# Patient Record
Sex: Female | Born: 1975 | Race: White | Hispanic: No | Marital: Single | Smoking: Former smoker
Health system: Western US, Academic
[De-identification: ages and names within clinical notes are randomized; demographics above are authoritative.]

## PROBLEM LIST (undated history)

## (undated) DIAGNOSIS — J45909 Unspecified asthma, uncomplicated: Secondary | ICD-10-CM

## (undated) DIAGNOSIS — E079 Disorder of thyroid, unspecified: Secondary | ICD-10-CM

## (undated) DIAGNOSIS — F32A Depression, unspecified: Secondary | ICD-10-CM

## (undated) DIAGNOSIS — F419 Anxiety disorder, unspecified: Secondary | ICD-10-CM

## (undated) DIAGNOSIS — T148XXA Other injury of unspecified body region, initial encounter: Secondary | ICD-10-CM

## (undated) HISTORY — DX: Other injury of unspecified body region, initial encounter: T14.8XXA

## (undated) HISTORY — DX: Disorder of thyroid, unspecified: E07.9

## (undated) HISTORY — DX: Depression, unspecified: F32.A

## (undated) HISTORY — DX: Unspecified asthma, uncomplicated: J45.909

## (undated) HISTORY — DX: Anxiety disorder, unspecified: F41.9

## (undated) HISTORY — PX: LOWER EXTREMITY BONE SURGERY: SHX900009

## (undated) HISTORY — PX: HYSTERECTOMY: AMBSHX0005

## (undated) NOTE — ED Provider Notes (Signed)
Formatting of this note is different from the original.  Danielle Barber is a 89 Y female.    Chief Complaint   Patient presents with   ? SHORTNESS OF BREATH     Triage note - presented flushed faced and hyperventilating, ambulatory, airway clear, CC sob. HR 170 intially, after seated and breathing slowed HR 144. pt states she has had cough, hx of asthma, took approx 5-6 puffs albuterol MDI and 2 energy drinks and coffee. denies recent fever or cardiac hx    HPI: 62 year old female with past medical history of asthma, hypothyroidism, insomnia presenting with complaint of shortness of breath.  Patient notes 1 week of a largely nonproductive cough noting occasional phlem but more likely causing her to have coughing fits and gag intermittently.  She denies fevers or chills.  She notes this morning after her shower she began coughing and had a coughing fit where she could not breathe.  She notes she feels like she might of had a panic attack with this.  Her heart rate was going fast and she felt short of breath.  She denies chest pain or pressure but notes that her chest felt tight.  She denies history of leg pain swelling, immobility, history of DVT or PE. No n/v, no diaphoresis.   She is not a smoker and denies drug use.  No recent sick contacts.  She notes she does consume large amounts of caffeine and had 2 energy drinks and coffee earlier today.  She also notes using albuterol frequently this morning.  She notes history of 1 panic attack in the past.  She denies new stressors or suicidal or homicidal ideation.  She notes symptoms resolving without interventions       PMH:  Patient Active Problem List:     INSOMNIA    ALLERGIC RHINITIS    MODERATE PERSISTENT ASTHMA    HYPOTHYROIDISM    LEFT FIBULA FX    HX OF TOTAL HYSTERECTOMY    Allergies:    Allergies   Allergen Reactions   ? Septra [Co-Trimoxazole] Rash     Was taking bacterim at the same time, could be sulfa.  Needs testing.   ? Keflex [Cephalexin] Rash     Was  taking bacterim at the same time, could be sulfa.  Needs testing.     SocHx:      Social History     Tobacco Use   ? Smoking status: Former     Packs/day: 1.00     Years: 8.00     Pack years: 8.00     Types: Cigarettes     Start date: 05/16/1994     Quit date: 05/16/2002     Years since quitting: 19.5   ? Smokeless tobacco: Never   Vaping Use   ? Vaping Use: Never used   Substance Use Topics   ? Alcohol use: Yes     Alcohol/week: 0.0 standard drinks     Comment: occasionally   ? Drug use: No     E-Cigarettes/Vaping     Questions Responses    E-Cigarette/Vaping Use Never User    Passive Exposure No    Counseling Given No     E-Cigarette/Vaping Substances     Questions Responses    Nicotine No    THC No    CBD No         FamHx:   Family History   Problem Relation Age of Onset   ? Hypertension Mother    ?  Ovarian Cancer Maternal Grandmother    ? Colon Cancer Maternal Uncle    ? Breast Cancer None    ? Cervical Cancer None    ? Uterine Cancer None      Review of Systems   Constitutional: Negative.  Negative for chills and fever.   HENT: Negative.  Negative for hearing loss.    Eyes: Negative.  Negative for discharge and redness.   Respiratory: Positive for cough, chest tightness and shortness of breath.    Cardiovascular: Positive for palpitations. Negative for chest pain and leg swelling.   Gastrointestinal: Negative.  Negative for abdominal pain, nausea and vomiting.   Genitourinary: Negative.  Negative for dysuria, frequency and urgency.   Musculoskeletal: Negative.  Negative for back pain and neck pain.   Skin: Negative.  Negative for rash.   Allergic/Immunologic: Negative for environmental allergies.   Neurological: Negative.  Negative for headaches.   Hematological: Does not bruise/bleed easily.   Psychiatric/Behavioral: Negative.  Negative for suicidal ideas.     Patient Vitals for the past 24 hrs:   Temp Temp Source Pulse BP Resp SpO2 O2 Delivery   11/22/21 1614 98.5 F (36.9 C) Temporal Artery Scan 110 152/81 19  98 % RA   11/22/21 1430 -- -- 109 142/63 18 96 % RA   11/22/21 1212 99 F (37.2 C) Oral 148 139/81 32 99 % RA     Physical Exam  Constitutional:       General: She is not in acute distress.     Appearance: She is well-developed. She is not ill-appearing.   HENT:      Right Ear: Hearing normal. No drainage.      Left Ear: Hearing normal. No drainage.      Nose: Nose normal. No nasal deformity.      Mouth/Throat:      Mouth: Mucous membranes are moist.      Pharynx: No oropharyngeal exudate or posterior oropharyngeal erythema.   Eyes:      General: No scleral icterus.     Conjunctiva/sclera: Conjunctivae normal.   Neck:      Thyroid: No thyromegaly (visable).      Trachea: No tracheal deviation.   Cardiovascular:      Rate and Rhythm: Regular rhythm. Tachycardia present.      Pulses: Normal pulses.      Heart sounds: Normal heart sounds.   Pulmonary:      Effort: Pulmonary effort is normal. No tachypnea, accessory muscle usage or respiratory distress.      Breath sounds: Normal breath sounds. No decreased breath sounds or wheezing.      Comments: CTA bilaterally  No increased WOB, no wheezing/retractions  Chest:      Chest wall: No tenderness or crepitus.   Abdominal:      General: Bowel sounds are normal. There is no distension.      Tenderness: There is no abdominal tenderness.   Musculoskeletal:      Comments: No calf pain/swelling   Neurological:      Mental Status: She is alert and oriented to person, place, and time.      GCS: GCS eye subscore is 4. GCS verbal subscore is 5. GCS motor subscore is 6.      Cranial Nerves: Cranial nerves 2-12 are intact.      Sensory: Sensation is intact.      Motor: Motor function is intact.      Gait: Gait is intact.   Psychiatric:  Thought Content: Thought content does not include homicidal or suicidal ideation. Thought content does not include homicidal or suicidal plan.         Judgment: Judgment normal.     Impression    58 year old female with past medical history of  asthma, hypothyroidism, insomnia presenting with complaint of shortness of breath, tachycardia, cough x 1 week    Diff Dx  Panic attache, arrhythmia, electrolyte abnormality, dehydration ACS/PE, medication side effect, caffeine side effect    Plan  EKG  Chest Xray  Labs  IVF  Serial exams    Results:  Recent Results (from the past 48 hour(s))   CHEM 7 (NA, K, CL, CO2, BUN, GLUC, CR)    Collection Time: 11/22/21 12:46 PM   Result Value Ref Range    Sodium 138 135 - 145 mEq/L    Potassium 3.6 3.5 - 5.3 mEq/L    Chloride 102 100 - 111 mEq/L    CO2 24 24 - 33 mEq/L    Anion gap, ser/plas 12 5 - 16 mEq/L    BUN 13 7 - 27 mg/dL    GLUCOSE, RANDOM 960 60 - 159 mg/dL    Creatinine 4.54 <=0.98 mg/dL    Estimated Glomerular Filtration Rate >60 >=60 mL/min/1.73 m2   MAGNESIUM    Collection Time: 11/22/21 12:46 PM   Result Value Ref Range    MAGNESIUM 1.8 1.7 - 2.3 mg/dL   TROPONIN I, HIGH SENSITIVITY    Collection Time: 11/22/21 12:46 PM   Result Value Ref Range    TROPONIN I, HIGH SENSITIVITY <2 <=12 ng/L   B-TYPE NATRIURETIC PEPTIDE (BNP)    Collection Time: 11/22/21 12:46 PM   Result Value Ref Range    B type natriuretic protein 25 <=100 pg/mL   D-DIMER, QUANTITATIVE    Collection Time: 11/22/21 12:46 PM   Result Value Ref Range    D-DIMER <0.27 <=0.49 ug/mL FEU   CBC W AUTOMATED DIFFERENTIAL    Collection Time: 11/22/21 12:46 PM   Result Value Ref Range    WBC COUNT 14.3 (H) 3.7 - 11.1 K/uL    Red blood cells count 4.56 3.60 - 5.70 M/uL    Hgb 13.8 11.5 - 15.0 g/dL    Hematocrit 11.9 14.7 - 46.0 %    MCV 91 80 - 100 fL    RDW, RBC 14.2 12.0 - 16.5 %    Platelets count 404 (H) 140 - 400 K/uL    RBC's, nucleated 0 <=0 /100WC   WBC AUTO DIFF    Collection Time: 11/22/21 12:46 PM   Result Value Ref Range    Neutrophils, Automated Count 10.6 (H) 1.8 - 7.9 K/uL    Lymphocytes, Automated Count 2.2 0.9 - 3.2 K/uL    Monocytes, Automated Count 1.0 (H) 0.3 - 0.9 K/uL    Eosinophils, Automated Count 0.4 0.0 - 0.4 K/uL    Basophils,  Automated Count 0.1 0.0 - 0.1 K/uL    Immature Granulocytes, Automated Count 0.1 0.0 - 0.1 K/uL    Neutrophils %, Automated count 74 %    Lymphocytes %, Automated count 15 %    Monocytes %, Automated Count 7 %    Eosinophils %, Automated Count 2 %    Basophils %, Automated Count 1 %    Immature Granulocytes %, Automated Count 1 %   DRAW AND HOLD SERUM, RED TOP TUBE    Collection Time: 11/22/21  3:05 PM   Result Value Ref Range  Draw and hold plain red top tube COMPLETE    LACTIC ACID    Collection Time: 11/22/21  3:08 PM   Result Value Ref Range    Lactate, ser/plas 0.9 0.5 - 1.9 mmol/L    Narrative    RN, please cancel second lactate Lab order if initial result is less than 2.0  mg/dL.   TROPONIN I, HIGH SENSITIVITY    Collection Time: 11/22/21  3:28 PM   Result Value Ref Range    TROPONIN I, HIGH SENSITIVITY <2 <=12 ng/L    TROPONIN I, HIGH SENSITIVITY, DELTA VALUE 0 ng/L    TROPONIN I, HIGH SENSITIVITY DELTA TIME 162 minute(s)   SARS-COV-2, (COVID-19), RNA, QUALITATIVE RT-NAA    Collection Time: 11/22/21  3:41 PM   Result Value Ref Range    SARS-COV-2 (COVID-19) PANEL, NAA COVID NOT DETECTED     Specimen source NP/OP      Labs notable for neg trop x 2, neg d-dimer, normal electrolytes including mag, BNP 25, WBC 14, lactate 0.9, COVID neg    EKG: (my reading) sinus tachycardia, Rate- 129 bpm, QRS- Normal, ST/T segments- Normal.  The initial EKG is not consistent with criteria for STEMI.    XR CHEST, PA AND LATERAL...    Result Date: 11/22/2021  XRAY CHEST ** HISTORY **: 25 years old,  Shortness of Breath ** TECHNIQUE **: 2 views of the chest acquired. COMPARISON: None available. ** FINDINGS **: LUNGS: No focal consolidation, pneumothorax or significant pleural effusion. MEDIASTINUM/OTHER: Normal heart size and mediastinal contours.     No acute disease.     Emergency Department Course:   Patient with no arrhythmia on EKG or cardiac monitor  Patient given IVF and HR improved from 120's on EKG to 90's on repeat  exam.   Patient with no chest pain throughout ER visit  Patient with no increased WOB on presentation or on repeat exam  Xray neg for acute process  WBC elevated - in setting of likely viral illness/cough and stress reaction. Lactate normal    Patient notes symptoms resolved and notes she is feeling better  Plan for discharge home with albuterol/spacer and tessalon pearls  Work note provided  Encouraged to limit caffeine intake    Pt to follow up with PCP and referral placed for TAV  Strict return precautions reviewed with pt    Final Diagnosis:   COUGH, UNSPECIFIED  (primary encounter diagnosis)  TACHYCARDIA  Note: Resolved with IVF, likely multifactorial - panic attack, albuterol and caffeine side effect    Condition on discharge / transfer : stable    Routine conservative discharge instructions were given and the patient is to follow up with the regular doctor or return if worse. Findings of pertinent objective studies were reviewed with the patient. Potential medication side effects, cautions, risks, and rationale were discussed.   Discussed the care provided, diagnosis, expected course, what to watch/return for, and follow up with regular MD; these understood.     I have reviewed the clinical diagnoses listed below which were considered in the care of this patient.  At the time of this visit there are no changes in these conditions unless otherwise noted.  The patient will be advised to follow up after discharge with their PCP or appropriate specialist as treatment warrants.    Clinical Diagnoses:      MODERATE PERSISTENT ASTHMA    HYPOTHYROIDISM             Electronically signed by Dorthy Cooler  Ladona Ridgel (M.D.) at 11/22/2021 10:27 PM PDT

## (undated) NOTE — Telephone Encounter (Signed)
Please follow up. Thank you.     Electronically signed by Jeri Modena (M.D.) at 03/21/2022  4:42 PM PST

## (undated) NOTE — Progress Notes (Signed)
Formatting of this note is different from the original.  Endocrinology Video Consultation    CC:  Thyroid     HPI:  Ms. Danielle Barber is a 42 Y old female, who was referred to Endocrinology for evaluation of Hypothyroidism.    Econsult:  "pt with hx of hypothyroidism since at least 2016. has been on levothyroxine and TSH fairly stable until May 2023. despite gradual increases in her levothyroxine, pt continues to have rise in her tsh."    Was on thyroid hormone 8-9 y/o and then stopped. Then about 5-6 years ago, she was restarted on levothyroxine.    Dose was increased from 137 mcg to 175 mcg 2 months ago. Labs not yet repeated.  When she was on 137 mcg she was having dizzy spells. Feeling better on 175 mcg.    Takes medication with a caffeine energy drink.  Takes it daily.  Tries to wait 30 minutes before eating food.    Wondering if the hormones are causing more issues with rosacea.  Interested in why her TSH is fluctuating.    I reviewed Meds, Allergies & Problems.    Medications:  I reviewed the medications list.  Outpatient Medications Marked as Taking for the 03/09/22 encounter (Video Visit) with Eliot Ford (M.D.)   Medication Sig Dispense Refill   ? Escitalopram (LEXAPRO) 20 mg Oral Tab Take 1 tablet by mouth daily 100 tablet 3   ? Levothyroxine (LEVOTHROID/SYNTHROID) 175 mcg Oral Tab Take 1 tablet by mouth daily 30 minutes before breakfast 90 tablet 3   ? buPROPion (WELLBUTRIN XL) 300 mg Oral 24hr XL Tab Take 1 tablet by mouth every morning 100 tablet 0   ? Montelukast (SINGULAIR) 10 mg Oral Tab Take 1 tablet by mouth every evening 90 tablet 2   ? inhaler, assist devices (AeroChamber Z STAT Plus Flow Signal Inhaler Spacer) Misc Spacer Use as directed with inhaler 1 Each 0   ? Albuterol (PROAIR/PROVENTIL/VENTOLIN) 90 mcg/actuation Inhl HFAA Inhale 2 puffs by mouth every 4 hours as needed for quick relief of asthma symptoms . Shake well before use. Rinse mouthpiece at least 1 time every week 18 g 1   ?  Clobetasol (TEMOVATE) 0.05 % Top Oint Apply to affected area(s) 2 times a day as needed for rash . Stop when rash is gone. Do not use on face, neck or skin folds 60 g 3   ? Acetaminophen (TYLENOL) 500 mg Oral Tab Take 1 to 2 tablets by mouth every 6 hours as needed for pain or fever. Do not exceed 6 tablets in 24 hours 100 tablet 0     Physical Exam:  APC Flowsheet 11/22/2021   BP sys 152   BP dias 81   Pulse 110   SPO2 98   HEIGHT    WEIGHT 101.6 kg   WEIGHT 224 lbs   BMI 40.97     General: No acute distress  Affect:  Appropriate    Data Reviewed:  I personally reviewed the following labs and studies.    Thyroid Lab 01/23/2015 June 19, 2016 07/22/2016 11/15/2017 01/30/2018 07/05/2019   TSH 9.40 (H) 6.11 (H) 16.58 (H) 4.55 (H) 2.0 3.9     Thyroid Lab 01/01/2020 09/24/2021 11/22/2021 01/11/2022 on levo 125 mcg   TSH 3.1 5.3 (H) 8.1 (H) 11.0 (H)     -----------------------------------------------------------------------------------------------------------------------------------------  Assessment/Plan:    1.  Hypothyroidism:  Needs repeat labs to assess 175 mcg dose. We discussed appropriate admin. If her TSH is normal,  it may decrease with taking the medication with only water.    Plan:  - Check TSH, FT4 now. Explained TPO ab not indicated  - Celiac panel    I have educated/instructed the patient or caregiver regarding all aspects of the above stated plan of care.  The patient or caregiver indicated understanding.  An interpreter was not used.    FOLLOW-UP:  Patient will follow-up in Endocrinology clinic PRN. Patient is aware that she will need to call to make an appointment.    You Tilda Franco, M.D.  Department of Endocrinology and Metabolism  Electronically signed by Eliot Ford (M.D.) at 03/09/2022 10:24 AM PDT

## (undated) NOTE — Unmapped External Note (Signed)
Formatting of this note is different from the original.  TELEDERMATOLOGY CONSULTATION NOTE:    ASSESSMENT/PLAN:    Nummular eczema vs allergic contact dermatitis  -stop neosporin and bandaging  -clobetasol ointment and mupirocin ointment mix 1:1 and apply 3 times daily for 1 week. Then twice daily for 1 week. Then once daily for 1 week.  Then use as needed  -Moisturize daily with a fragrance free cream such as Cerave Moisturizing cream, Aveeno Eczema Therapy Cream or Eucerin Eczema Relief Cream can help to prevent the eczema from coming back    PLEASE DO NOT CUT/PASTE BELOW THIS LINE  ========================================================================    Below documentation will provide data for Teledermatology improvement purposes.    Request sent for: Rash/inflammatory.  Image type: Stentor  Dermoscopy: Absent  Clinical photographs quality: Adequate  Disposition: Advice given and/or follow up with referring provider     History and photograph(s) reviewed.  From referring MD:   "  ROS     Rash   What is your clinical concern (confirm dx, tx recs)? Diagnosis and treat rec   Duration of rash (days/wks/months/yrs): 3 years on and off post ankle surgery   Body location(s), be specific: left ankle, lateral aspect   Symptoms (be specific: itching, painful, none): itching, sometimes with some oozing   Past treatments/duration: neosporin and triamcinolone for months back in 2019, rarely uses neosporin now. Also uses over the counter hydrocortisone PRN but helps only momentarily.    Current treatment: trying not to touch it. Sometimes just putting a bandage over it.   Any relevant history (URI, meds, exposures, stress, travel, etc): none     "    For questions regarding this teledermatology case, please contact me directly through [facility] or [employer]. Thanks.    Melanee Spry, MD   Department of Dermatology  [facility]   Electronically signed by Melanee Spry (M.D.) at 04/24/2020  9:57 AM PST

## (undated) NOTE — Progress Notes (Signed)
Formatting of this note is different from the original.  Clinical Video Visit Note     Assessment & Plan     (E03.9) HYPOTHYROIDISM  (primary encounter diagnosis)  -TSH continues to uptrend despite increase in levothyroxine. Patient taking on empty stomach as prescribed. Patient with some increased symptoms of fatigue. Called pconsult endocrinology, but no answer. Suspect they were on the other line. Referral was placed to endocrinology. Patient has appointment with endocrinology on Monday. Advised patient to get updated labs tomorrow so that endocrinology team has more updated values to review. No red flag symptoms at this time. Patient expressed understanding and is agreeable to plan.     History of Present Illness   Danielle Barber is a 19 Y female scheduled to discuss the following issues:thyroid issues.     pt with hx of hypothyroidism since at least 2016. has been on levothyroxine and TSH fairly stable until May 2023. despite gradual increases in her levothyroxine, pt continues to have rise in her tsh. Also was getting dizzy (which resolved with last increase in levothyroxine). Always fatigued. Wondering what best next steps are as continuing to increase the levothyroxine has not been contributing to improvements in TSH. Instead, the TSH levels continue to uptrend.     Objective   LMP  (LMP Unknown)   Constitutional: Patient appears normal without distress.  Eyes: Normal eyes w/out inflammation or conjunctivitis, extraocular (eye) movements intact  Ears, Nose, Throat: Normal facial symmetry, no skin lesions, normal voice  Respiratory: No tachypnea, no unusual cough  Psychiatric: Stable emotional state, normal thought patterns    Electronically signed by Izola Price (M.D.) at 03/03/2022  2:33 PM PDT

## (undated) NOTE — Unmapped External Note (Signed)
Formatting of this note is different from the original.  Outpatient Medications Marked as Taking for the 03/09/22 encounter (Video Visit) with Eliot Ford (M.D.)   Medication Sig Dispense Refill   ? Escitalopram (LEXAPRO) 20 mg Oral Tab Take 1 tablet by mouth daily 100 tablet 3   ? Levothyroxine (LEVOTHROID/SYNTHROID) 175 mcg Oral Tab Take 1 tablet by mouth daily 30 minutes before breakfast 90 tablet 3   ? buPROPion (WELLBUTRIN XL) 300 mg Oral 24hr XL Tab Take 1 tablet by mouth every morning 100 tablet 0   ? Montelukast (SINGULAIR) 10 mg Oral Tab Take 1 tablet by mouth every evening 90 tablet 2   ? inhaler, assist devices (AeroChamber Z STAT Plus Flow Signal Inhaler Spacer) Misc Spacer Use as directed with inhaler 1 Each 0   ? Albuterol (PROAIR/PROVENTIL/VENTOLIN) 90 mcg/actuation Inhl HFAA Inhale 2 puffs by mouth every 4 hours as needed for quick relief of asthma symptoms . Shake well before use. Rinse mouthpiece at least 1 time every week 18 g 1   ? Clobetasol (TEMOVATE) 0.05 % Top Oint Apply to affected area(s) 2 times a day as needed for rash . Stop when rash is gone. Do not use on face, neck or skin folds 60 g 3   ? Acetaminophen (TYLENOL) 500 mg Oral Tab Take 1 to 2 tablets by mouth every 6 hours as needed for pain or fever. Do not exceed 6 tablets in 24 hours 100 tablet 0   Benadryl prn  Electronically signed by Michelle Nasuti (M.A.) at 03/09/2022  9:52 AM PDT

---

## 2004-09-17 DIAGNOSIS — F32A Depression, unspecified: Secondary | ICD-10-CM | POA: Insufficient documentation

## 2004-09-17 DIAGNOSIS — J309 Allergic rhinitis, unspecified: Secondary | ICD-10-CM | POA: Insufficient documentation

## 2004-09-17 DIAGNOSIS — G4700 Insomnia, unspecified: Secondary | ICD-10-CM | POA: Insufficient documentation

## 2005-06-23 DIAGNOSIS — R0600 Dyspnea, unspecified: Secondary | ICD-10-CM | POA: Insufficient documentation

## 2005-06-23 DIAGNOSIS — G4769 Other sleep related movement disorders: Secondary | ICD-10-CM | POA: Insufficient documentation

## 2009-05-25 DIAGNOSIS — J4540 Moderate persistent asthma, uncomplicated: Secondary | ICD-10-CM | POA: Insufficient documentation

## 2015-01-29 DIAGNOSIS — E039 Hypothyroidism, unspecified: Secondary | ICD-10-CM | POA: Insufficient documentation

## 2015-12-31 DIAGNOSIS — S82402A Unspecified fracture of shaft of left fibula, initial encounter for closed fracture: Secondary | ICD-10-CM | POA: Insufficient documentation

## 2017-11-15 LAB — THYROID STIMULATING HORMONE: Thyroid Stimulating Hormone: 4.55 u[IU]/mL — ABNORMAL HIGH (ref 0.40–4.20)

## 2017-11-15 LAB — CBC NO DIFFERENTIAL
Hematocrit: 43 % (ref 34.0–46.0)
Hemoglobin: 13.4 g/dL (ref 11.5–15.0)
MCV: 98 fL (ref 80–100)
Platelets count: 375 10*3/uL (ref 140–400)
RBC's, nucleated: 0 /100{WBCs} (ref <=0)
RDW, RBC: 13.6 % (ref 12.0–16.5)
Red blood cells count: 4.39 M/uL (ref 3.60–5.70)
WBC Count: 9.9 10*3/uL (ref 3.7–11.1)

## 2017-11-15 LAB — WBC AUTO DIFF (OUTSIDE ORGANIZATION)
Basophils % Auto: 1 % (ref 0–1)
Eosinophils % Auto: 4 % (ref 0–7)
Immature Granulocytes % Auto: 0 % (ref 0–1)
Lymphocytes % Auto: 16 % (ref 15–47)
Monocytes % Auto: 8 % (ref 5–13)
Neutrophils % Auto: 71 % (ref 42–76)
Neutrophils, Automated Count: 7 10*3/uL (ref 1.8–7.9)

## 2018-01-30 LAB — THYROID STIMULATING HORMONE: Thyroid Stimulating Hormone: 2 u[IU]/mL (ref 0.4–4.2)

## 2019-07-05 LAB — CBC NO DIFFERENTIAL
Hematocrit: 45.5 % (ref 34.0–46.0)
Hemoglobin: 14.1 g/dL (ref 11.5–15.0)
MCV: 92 fL (ref 80–100)
Platelets count: 502 10*3/uL — ABNORMAL HIGH (ref 140–400)
RBC's, nucleated: 0 /100{WBCs} (ref <=0)
RDW, RBC: 13.1 % (ref 12.0–16.5)
Red blood cells count: 4.93 M/uL (ref 3.60–5.70)
WBC Count: 10.7 10*3/uL (ref 3.7–11.1)

## 2019-07-05 LAB — WBC AUTO DIFF (OUTSIDE ORGANIZATION)
Basophils % Auto: 1 % (ref 0–1)
Eosinophils % Auto: 5 % (ref 0–7)
Immature Granulocytes % Auto: 0 % (ref 0–1)
Lymphocytes % Auto: 24 % (ref 15–47)
Monocytes % Auto: 10 % (ref 5–13)
Neutrophils % Auto: 60 % (ref 42–76)
Neutrophils, Automated Count: 6.4 10*3/uL (ref 1.8–7.9)

## 2019-07-06 LAB — THYROID STIMULATING HORMONE: Thyroid Stimulating Hormone: 3.9 u[IU]/mL (ref 0.4–4.2)

## 2020-01-01 LAB — ABORH (OUTSIDE ORGANIZATION)
ABORH Blood Type: A POS
ABORH Blood Type: A POS

## 2020-01-02 LAB — CBC NO DIFFERENTIAL
Hematocrit: 28.9 % — ABNORMAL LOW (ref 34.0–46.0)
Hemoglobin: 8.5 g/dL — ABNORMAL LOW (ref 11.5–15.0)
MCV: 76 fL — ABNORMAL LOW (ref 80–100)
Platelets count: 706 10*3/uL — ABNORMAL HIGH (ref 140–400)
RBC's, nucleated: 0 /100{WBCs} (ref <=0)
RDW, RBC: 15.3 % (ref 12.0–16.5)
Red blood cells count: 3.78 M/uL (ref 3.60–5.70)
WBC Count: 11.9 10*3/uL — ABNORMAL HIGH (ref 3.7–11.1)

## 2020-01-02 LAB — CREATININE/E-GFR
Creatinine Blood: 0.93 mg/dL (ref <=1.11)
Glomerular Filtration Rate - African American: >60 mL/min (ref >=60)
Glomerular filtration rate, nonAfrican American: >60 mL/min (ref >=60)

## 2020-01-02 LAB — WBC AUTO DIFF (OUTSIDE ORGANIZATION)
Basophils % Auto: 1 % (ref 0–1)
Eosinophils % Auto: 4 % (ref 0–7)
Immature Granulocytes % Auto: 0 % (ref 0–1)
Lymphocytes % Auto: 19 % (ref 15–47)
Monocytes % Auto: 9 % (ref 5–13)
Neutrophils % Auto: 67 % (ref 42–76)
Neutrophils, Automated Count: 8 10*3/uL — ABNORMAL HIGH (ref 1.8–7.9)

## 2020-01-02 LAB — THYROID STIMULATING HORMONE: Thyroid Stimulating Hormone: 3.1 u[IU]/mL (ref 0.4–4.2)

## 2020-01-02 LAB — FERRITIN: Ferritin: <2 ng/mL — ABNORMAL LOW (ref 22–291)

## 2020-01-05 LAB — HAPPY TOGETHER UNMAPPED RESULTS: COVID-19 Results (Outside Organization): NOT DETECTED

## 2020-01-31 LAB — GLUCOSE: Glucose: 103 mg/dL (ref 60–159)

## 2020-01-31 LAB — CREATININE/E-GFR
Creatinine Blood: 0.95 mg/dL (ref <=1.11)
Glomerular Filtration Rate - African American: >60 mL/min (ref >=60)
Glomerular filtration rate, nonAfrican American: >60 mL/min (ref >=60)

## 2020-01-31 LAB — UREA NITROGEN, BLOOD (BUN): Urea Nitrogen, Blood (BUN): 8 mg/dL (ref 7–27)

## 2020-01-31 LAB — POTASSIUM: Potassium: 4.4 meq/L (ref 3.5–5.3)

## 2020-01-31 LAB — SODIUM: Sodium: 139 meq/L (ref 135–145)

## 2020-01-31 LAB — CHLORIDE: Chloride: 104 meq/L (ref 100–111)

## 2020-01-31 LAB — ABORH (OUTSIDE ORGANIZATION): ABORH Blood Type: A POS

## 2020-01-31 LAB — CARBON DIOXIDE TOTAL: CO2: 27 meq/L (ref 24–33)

## 2020-02-01 LAB — CBC NO DIFFERENTIAL
Hematocrit: 38 % (ref 34.0–46.0)
Hemoglobin: 11.1 g/dL — ABNORMAL LOW (ref 11.5–15.0)
MCV: 87 fL (ref 80–100)
Platelets count: 454 10*3/uL — ABNORMAL HIGH (ref 140–400)
RBC's, nucleated: 0 /100{WBCs} (ref <=0)
RDW, RBC: 27.2 % — ABNORMAL HIGH (ref 12.0–16.5)
Red blood cells count: 4.36 M/uL (ref 3.60–5.70)
WBC Count: 7.8 10*3/uL (ref 3.7–11.1)

## 2020-02-01 LAB — WBC AUTO DIFF (OUTSIDE ORGANIZATION)
Basophils % Auto: 1 % (ref 0–1)
Eosinophils % Auto: 4 % (ref 0–7)
Immature Granulocytes % Auto: 0 % (ref 0–1)
Lymphocytes % Auto: 20 % (ref 15–47)
Monocytes % Auto: 10 % (ref 5–13)
Neutrophils % Auto: 65 % (ref 42–76)
Neutrophils, Automated Count: 5 10*3/uL (ref 1.8–7.9)

## 2020-02-01 LAB — RBC MORPHOLOGY (OUTSIDE ORGANIZATION)

## 2020-02-01 LAB — HEMOGLOBIN A1C
Hgb A1C,Glucose Est Avg: 82 mg/dL — ABNORMAL LOW (ref 85–126)
Hgb A1c %: 4.5 % (ref <=5.6)

## 2020-02-01 LAB — FERRITIN: Ferritin: 133 ng/mL (ref 22–291)

## 2020-02-04 LAB — HAPPY TOGETHER UNMAPPED RESULTS: COVID-19 Results (Outside Organization): NOT DETECTED

## 2020-02-17 DIAGNOSIS — Z90710 Acquired absence of both cervix and uterus: Secondary | ICD-10-CM | POA: Insufficient documentation

## 2020-04-21 ENCOUNTER — Ambulatory Visit: Payer: 59

## 2020-04-21 DIAGNOSIS — Z23 Encounter for immunization: Secondary | ICD-10-CM

## 2020-04-21 NOTE — Progress Notes (Signed)
Patient WAS wearing a surgical mask  Droplet precautions were followed when caring for the patient.   PPE used by provider during encounter: Surgical mask and Gloves    Influenza Vaccine Documentation:  The patient is receiving their Influenza Vaccine today: No    COVID-19 Vaccine Documentation:  The patient is eligible to receive the COVID-19 Vaccine at this time: Yes    The patient has received a COVID vaccine previously: Yes     Immunization History   Administered Date(s) Administered        COVID-19 mRNA PF 30 mcg/0.3 mL (Pfizer)  COVID-19 mRNA PF 30 mcg/0.3 mL [employer])   07/24/2019   08/14/2019         The patient or person named in permission acknowledges receipt of the Fact Sheet for Recipients and Caregivers, which includes information about the risks and benefits of the vaccine and available alternatives, for the following vaccine manufacturer: Pfizer-BioNtech     The patient or person named in permission was informed the FDA has approved [employer] COVID-19 Vaccine as a 2-dose primary series for ages 78 and older. The FDA has authorized the emergency use of the Pfizer COVID-19 Vaccine as a primary series additional 3rd dose for immunocompromised individuals ages 59 and older and as a booster dose given after the completion of a primary series for ages 37 and older, which is not an FDA-approved vaccine additional dose or booster and that the patient may accept or refuse the vaccine.     Pre-Screening Documentation:        Additional and Booster Dose Considerations  Is the patient receiving a 3rd or Booster dose today?: Yes - Booster Dose  If Yes - Booster Dose, confirmed the patient's last dose of Moderna/Pfizer was administered more than 6 months ago OR patient's last dose of Linwood Dibbles was administered more than 2 months ago? : Yes    Janssen (Johnson & Laural Benes) Vaccine Considerations  Is patient receiving the Linwood Dibbles Laural Benes & Laural Benes) COVID-19 Vaccine?: No      COVID-19 Immunization Absolute  Contraindications  1. Have you ever had a severe allergic reaction to a previous dose of the COVID-19 Vaccine or to any ingredient of the COVID-19 Vaccine? (Review ingredients in the Fact Sheet for Recipients and Caregivers): No  2. Have you ever had an immediate allergic reaction of any severity within 4 hours of receiving a previous dose of the COVID-19 Vaccine or any of its ingredients?: No  3. Have you ever had an immediate allergic reaction of any severity to polysorbate or polyethylene glycol [PEG]?: No  Did the patient answer yes to any of questions 1-3?: No (proceed to question 4)      COVID-19 Immunization Myocarditis and Pericarditis Consideration  4. Do you have a history of myocarditis or pericarditis or have you been told you have had inflammation of your heart or of the sac around your heart?: No      COVID-19 Immunization Multisystem Inflammatory Syndrome Consideration  5. Within the past 90 days have you been diagnosed with Multisystem Inflammatory Syndrome (MISC-C or MIS-A) after a COVID-19 infection?: No      COVID-19 Immunization Clinical Considerations  6. Are you pregnant or breastfeeding?: No  7. Do you have a weakened immune system caused by something such as HIV infection or cancer, or do you take medications that affect the immune system?: No  8. Do you have a bleeding disorder or are you taking blood thinners? : No  Did the patient  answer yes to any of questions 6-8? : No (proceed to question 9)      COVID-19 Immunization Deferral Considerations  9. Have you had a confirmed COVID-19 exposure in the past 14 days?: No  10. Have you tested positive for COVID-19 in the last 14 days? : No  11. Are you feeling sick today? : No  Did the patient answer yes to any of questions 9-11?: No (proceed to question 12)       COVID-19 Immunization Antibody Deferral Considerations  12. In the past 90 days, have you tested positive for COVID-19 and received treatment?: No (proceed to question 13)        COVID-19 Immunization Dermal Filler Consideration  13. Have you received a dermal filler injection?: No (proceed to question 14)      COVID-19 Immunization Observation Considerations  14. Do you have a history of immediate allergic reaction of any severity to a vaccine?: No  15. Do you have a history of immediate allergic reaction of any severity to an injectable medication?: No  16. Do you have a history of severe allergic reaction due to any cause? : No  17. Have you ever fainted after receiving an injection?: No  Did the patient answer yes to any of questions 14-17? : No  If no, the patient was informed a 15-minute observation period is recommended after the vaccine to watch for a reaction.: Confirmed        All patient questions were answered and the patient agrees to receive the COVID-19 vaccine today.     Vaccine prepared and administered according to the current Emergency Use Authorization and Fact Sheet for Healthcare Providers Administering Vaccine.    Patient given vaccine card and discharge instructions with information about the Fact Sheet for Recipients and Caregivers and the v-safe program. Patient directed to observation area.     Birdena Jubilee, [place]

## 2020-06-10 ENCOUNTER — Other Ambulatory Visit: Payer: Self-pay

## 2021-09-24 LAB — CBC NO DIFFERENTIAL
Hematocrit: 44.1 % (ref 34.0–46.0)
Hemoglobin: 14.4 g/dL (ref 11.5–15.0)
MCV: 94 fL (ref 80–100)
Platelets count: 386 10*3/uL (ref 140–400)
RBC's, nucleated: 0 /100{WBCs} (ref <=0)
RDW, RBC: 13.3 % (ref 12.0–16.5)
Red blood cells count: 4.71 M/uL (ref 3.60–5.70)
WBC Count: 9.2 10*3/uL (ref 3.7–11.1)

## 2021-09-25 LAB — SODIUM: Sodium: 140 meq/L (ref 135–145)

## 2021-09-25 LAB — LIPID PANEL WITH DLDL REFLEX
Cholesterol: 210 mg/dL (ref <=239)
HDL Cholesterol: 94 mg/dL (ref >=50)
LDL Cholesterol Calculation: 105 mg/dL (ref <=159)
Triglyceride: 54 mg/dL (ref <=499)

## 2021-09-25 LAB — THYROID STIMULATING HORMONE: Thyroid Stimulating Hormone: 5.3 u[IU]/mL — ABNORMAL HIGH (ref 0.4–4.2)

## 2021-09-25 LAB — POTASSIUM: Potassium: 5.1 meq/L (ref 3.5–5.3)

## 2021-09-25 LAB — ALANINE TRANSFERASE (ALT): Alanine Transferase (ALT): 14 U/L (ref 0–41)

## 2021-09-27 LAB — HEMOGLOBIN A1C
Hgb A1C,Glucose Est Avg: 100 mg/dL (ref 85–126)
Hgb A1c %: 5.1 % (ref <=5.6)

## 2021-11-22 LAB — WBC AUTO DIFF (OUTSIDE ORGANIZATION)
Basophils % Auto: 1 %
Basophils Abs Auto: 0.1 10*3/uL (ref 0.0–0.1)
Eosinophils % Auto: 2 %
Eosinophils, Automated Count: 0.4 10*3/uL (ref 0.0–0.4)
Immature Granulocytes % Auto: 1 %
Immature Granulocytes, Automated Count: 0.1 10*3/uL (ref 0.0–0.1)
Lymphocytes % Auto: 15 %
Lymphocytes Abs Auto: 2.2 10*3/uL (ref 0.9–3.2)
Monocytes % Auto: 7 %
Monocytes Abs Auto: 1 10*3/uL — ABNORMAL HIGH (ref 0.3–0.9)
Neutrophils % Auto: 74 %
Neutrophils, Automated Count: 10.6 10*3/uL — ABNORMAL HIGH (ref 1.8–7.9)

## 2021-11-22 LAB — CHEM 7 (NA, K, CL, CO2, BUN, GLUC, CR) (OUTSIDE ORGANIZATION)
Anion Gap: 12 meq/L (ref 5–16)
CO2: 24 meq/L (ref 24–33)
Chloride: 102 meq/L (ref 100–111)
Creatinine Blood: 0.9 mg/dL (ref <=1.11)
Estimated Glomerular Filtration Rate: >60 mL/min/{1.73_m2} (ref >=60)
Glucose: 105 mg/dL (ref 60–159)
Potassium: 3.6 meq/L (ref 3.5–5.3)
Sodium: 138 meq/L (ref 135–145)
Urea Nitrogen, Blood (BUN): 13 mg/dL (ref 7–27)

## 2021-11-22 LAB — COVID-2019 RNA, QUAL: COVID-19 Results (Outside Organization): NOT DETECTED

## 2021-11-22 LAB — CBC WITH DIFFERENTIAL
Hematocrit: 41.3 % (ref 34.0–46.0)
Hemoglobin: 13.8 g/dL (ref 11.5–15.0)
MCV: 91 fL (ref 80–100)
Platelets count: 404 10*3/uL — ABNORMAL HIGH (ref 140–400)
RBC's, nucleated: 0 /100{WBCs} (ref <=0)
RDW, RBC: 14.2 % (ref 12.0–16.5)
Red blood cells count: 4.56 M/uL (ref 3.60–5.70)
WBC Count: 14.3 10*3/uL — ABNORMAL HIGH (ref 3.7–11.1)

## 2021-11-22 LAB — MAGNESIUM (MG): Magnesium (Mg): 1.8 mg/dL (ref 1.7–2.3)

## 2021-11-23 LAB — THYROID STIMULATING HORMONE: Thyroid Stimulating Hormone: 8.1 u[IU]/mL — ABNORMAL HIGH (ref 0.4–4.2)

## 2021-11-27 LAB — BLOOD CULTURE (OUTSIDE ORGANIZATION)

## 2022-01-12 LAB — LIPID PANEL, NON-FASTING (CHOL, DHDL, TRIG, CALC LDL W REFLEX TO DLDL) (OUTSIDE ORGANIZATION)
Cholesterol: 227 mg/dL (ref <=239)
HDL Cholesterol: 106 mg/dL (ref >=50)
LDL Cholesterol Calculation: 99 mg/dL (ref <=159)
Triglyceride, Nonfasting: 110 mg/dL (ref <=879)

## 2022-01-12 LAB — THYROID STIMULATING HORMONE: Thyroid Stimulating Hormone: 11 u[IU]/mL — ABNORMAL HIGH (ref 0.4–4.2)

## 2022-03-10 LAB — THYROID STIMULATING HORMONE: Thyroid Stimulating Hormone: 2.5 u[IU]/mL (ref 0.4–4.2)

## 2022-03-10 LAB — THYROXINE, FREE (FREE T4): Thyroxine, Free (Free T4): 1.3 ng/dL (ref 0.8–1.7)

## 2022-10-19 ENCOUNTER — Ambulatory Visit: Payer: Self-pay | Admitting: Family Medicine

## 2022-10-19 NOTE — Telephone Encounter (Signed)
Emergency Advice / Priscille Loveless:    Reason for Call:  Patient experiencing symptoms of sudden change to vision  , .  BATLINE CALL transferred to Nursing Triage back line.      Henreitta Cea  PSR II  Patient Contact Center

## 2022-10-19 NOTE — Telephone Encounter (Signed)
Danielle Barber is a 47yr old female  3 patient identifiers used.  Per:   patient.    Disposition: SEE PCP WITHIN 3 DAYS   Advice given per protocol   Per:   patient verbalizes agreement to plan. Agrees to callback with any increase in symptoms/concerns or questions.    See Assessment Below  Last month or so c/c blurry vision when reading or in the phone. With Hyperthyroidism, medication off and on, with changes Insurance, unable take it daily. Patient requesting to refill because she going to run out of it. Blurry brief now gone. Offers no other symptoms. No wheezing or stridor heard during triage. Pt speaking in full, complete sentences with clear speech. Patient is appropriate. Scheduled an appointment tomorrow. Patient verbalizes agreement to plan. Agrees to callback with any increase in symptoms/concerns or questions.         See Care Advice Below  Care Advice    Patient/Caregiver understands and will follow care advice?: Yes           Vision Loss or Change-ADULT-AH  Verdell Face, RN Wed Oct 19, 2022 12:02 PM      Care Advice       SEE PCP WITHIN 3 DAYS:  * You need to be seen within 2 or 3 days.  * PCP VISIT: Call your doctor (or NP/PA) during regular office hours and make an appointment. A clinic or urgent care center are good places to go for care if your doctor's office is closed or you can't get an appointment. NOTE: If office will be open tomorrow, tell caller to call then, not in 3 days.  * IF PATIENT HAS NO PCP: A clinic or urgent care center are good places to go for care if you do not have a primary care provider. NOTE: Try to help caller find a PCP for future care (e.g., use a physician referral line). Having a PCP or 'medical home' means better long-term care.    CALL BACK IF:  * Blurred vision recurs  * You develop other problems with vision.    CARE ADVICE given per Vision Loss or Change (Adult) guideline.                              See Protocol and Disposition Below  Reason for Disposition   [1]  Brief (now gone) blurred vision AND [2] unexplained    Additional Information   Negative: SEVERE headache   Negative: Double vision   Negative: [1] Blurred vision or visual changes AND [2] present now AND [3] sudden onset or new (e.g., minutes, hours, days)  (Exception: Seeing floaters / black specks OR previously diagnosed migraine headaches with same symptoms.)   Negative: Patient sounds very sick or weak to the triager   Negative: Weakness of the face, arm or leg on one side of the body   Negative: Followed getting substance in the eye   Negative: Foreign body stuck in the eye   Negative: Followed an eye injury   Negative: Followed sun lamp or sun exposure (UV keratitis)   Negative: Yellow or green discharge (pus) in the eye   Negative: Pregnant   Negative: Postpartum (from 0 to 6 weeks after delivery)   Negative: Complete loss of vision in one or both eyes   Negative: SEVERE eye pain   Negative: Flashes of light  (Exception: Brief from pressing on the eyeball.)   Negative: Many floaters in  the eye  (Exception: Floater(s) are a chronic symptom and this is unchanged from patient's baseline pattern.)   Negative: [1] Eye pain AND [2] brief (now gone) blurred vision or visual changes   Negative: [1] Taking digoxin (e.g., Lanoxin, Digitek, Cardoxin, Lanoxicaps; Toloxin in [place]) AND [2] blurred vision, yellow vision, or yellow-green halos   Negative: [1] Jaw pain while eating AND [2] age > 50 years   Negative: [1] Headache AND [2] age > 50 years   Negative: Single floater (i.e., small speck seems to float across the eye)  (Exception: Floater(s) are a chronic symptom and this is unchanged from patient's baseline pattern.)    Protocols used: Vision Loss or Change-ADULT-AH

## 2022-10-20 ENCOUNTER — Ambulatory Visit: Payer: No Typology Code available for payment source | Admitting: Family Medicine

## 2022-10-20 ENCOUNTER — Ambulatory Visit: Payer: No Typology Code available for payment source | Admitting: FAMILY PRACTICE

## 2022-10-20 ENCOUNTER — Ambulatory Visit: Payer: No Typology Code available for payment source | Attending: FAMILY PRACTICE

## 2022-10-20 ENCOUNTER — Encounter: Payer: Self-pay | Admitting: FAMILY PRACTICE

## 2022-10-20 VITALS — BP 148/85 | HR 127 | Temp 96.9°F | Ht 66.0 in | Wt 230.4 lb

## 2022-10-20 DIAGNOSIS — F32A Depression, unspecified: Secondary | ICD-10-CM

## 2022-10-20 DIAGNOSIS — Z1211 Encounter for screening for malignant neoplasm of colon: Secondary | ICD-10-CM

## 2022-10-20 DIAGNOSIS — E039 Hypothyroidism, unspecified: Secondary | ICD-10-CM

## 2022-10-20 DIAGNOSIS — J4540 Moderate persistent asthma, uncomplicated: Secondary | ICD-10-CM

## 2022-10-20 DIAGNOSIS — H538 Other visual disturbances: Secondary | ICD-10-CM

## 2022-10-20 DIAGNOSIS — H6590 Unspecified nonsuppurative otitis media, unspecified ear: Secondary | ICD-10-CM

## 2022-10-20 DIAGNOSIS — R030 Elevated blood-pressure reading, without diagnosis of hypertension: Secondary | ICD-10-CM

## 2022-10-20 LAB — CBC WITH DIFFERENTIAL
Basophils % Auto: 0.6 %
Basophils Abs Auto: 0.1 10*3/uL (ref 0.0–0.2)
Eosinophils % Auto: 4.9 %
Eosinophils Abs Auto: 0.4 10*3/uL (ref 0.0–0.5)
Hematocrit: 42.6 % (ref 34.0–46.0)
Hemoglobin: 14.5 g/dL (ref 12.0–16.0)
Lymphocytes % Auto: 15.1 %
Lymphocytes Abs Auto: 1.2 10*3/uL (ref 1.0–4.8)
MCH: 31 pg (ref 27.0–33.0)
MCHC: 34 % (ref 32.0–36.0)
MCV: 91.3 fL (ref 80.0–100.0)
MPV: 7.8 fL (ref 6.8–10.0)
Monocytes % Auto: 8.6 %
Monocytes Abs Auto: 0.7 10*3/uL (ref 0.1–0.8)
Neutrophils % Auto: 70.8 %
Neutrophils Abs Auto: 5.8 10*3/uL (ref 1.8–7.7)
Platelet Count: 353 10*3/uL (ref 130–400)
RDW: 13.9 % (ref 0.0–14.7)
Red Blood Cell Count: 4.67 10*6/uL (ref 3.70–5.50)
White Blood Cell Count: 8.2 10*3/uL (ref 4.5–11.0)

## 2022-10-20 LAB — COMPREHENSIVE METABOLIC PANEL
Alanine Transferase (ALT): 7 U/L (ref <=33)
Albumin: 4.4 g/dL (ref 4.0–4.9)
Alkaline Phosphatase (ALP): 48 U/L (ref 35–129)
Anion Gap: 14 mmol/L (ref 7–15)
Aspartate Transaminase (AST): 12 U/L (ref <=41)
Bilirubin Total: 0.3 mg/dL (ref <=1.2)
Calcium: 9.8 mg/dL (ref 8.6–10.0)
Carbon Dioxide Total: 25 mmol/L (ref 22–29)
Chloride: 101 mmol/L (ref 98–107)
Creatinine Serum: 0.9 mg/dL (ref 0.51–1.17)
E-GFR Creatinine (Female): 80 mL/min/{1.73_m2}
Glucose: 104 mg/dL (ref 74–109)
Potassium: 4.5 mmol/L (ref 3.4–5.1)
Protein: 7.8 g/dL (ref 6.6–8.7)
Sodium: 140 mmol/L (ref 136–145)
Urea Nitrogen, Blood (BUN): 11 mg/dL (ref 6–20)

## 2022-10-20 LAB — THYROID STIMULATING HORMONE: Thyroid Stimulating Hormone: 7.81 u[IU]/mL — ABNORMAL HIGH (ref 0.27–4.20)

## 2022-10-20 LAB — THYROXINE, FREE (FREE T4): Thyroxine, Free (Free T4): 1.49 ng/dL (ref 0.92–1.68)

## 2022-10-20 MED ORDER — MONTELUKAST 10 MG TABLET: 10.0000 mg | ORAL_TABLET | Freq: Every day | ORAL | 0 refills | Status: DC

## 2022-10-20 MED ORDER — LEVOTHYROXINE 175 MCG TABLET: 175.0000 ug | ORAL_TABLET | Freq: Every day | ORAL | 0 refills | Status: DC

## 2022-10-20 MED ORDER — ALBUTEROL SULFATE HFA 90 MCG/ACTUATION AEROSOL INHALER: 1.0000 | INHALATION_SPRAY | RESPIRATORY_TRACT | 0 refills | Status: DC | PRN

## 2022-10-20 MED ORDER — ESCITALOPRAM 20 MG TABLET
20.0000 mg | ORAL_TABLET | Freq: Every day | ORAL | 0 refills | Status: DC
Start: 2022-10-20 — End: 2023-01-23

## 2022-10-20 MED ORDER — BUPROPION HCL XL 300 MG 24 HR TABLET, EXTENDED RELEASE
300.0000 mg | EXTENDED_RELEASE_TABLET | Freq: Every day | ORAL | 0 refills | Status: DC
Start: 2022-10-20 — End: 2023-01-23

## 2022-10-20 MED ORDER — AZELASTINE 137 MCG (0.1 %) NASAL SPRAY: 2.0000 | Freq: Two times a day (BID) | NASAL | 0 refills | Status: DC

## 2022-10-20 NOTE — Progress Notes (Signed)
S: 47yo F with PMH significant for asthma present today.    PCP: Patient, No Pcp Per     Acute concern: vision changes  -Onset: 2-3 weeks  -was reading felt usually blurry, watery eye, and itchiness  -vision tend to happen when reading  -no pain, flash of light, dark spots, increase floater     #Ear clogged  -1 month ago was sick was using saline rinses when noticing ear popped. Felt clogged since     O:  Temp: 36.1 C (96.9 F) (06/06 0939)  Temp src: Temporal (06/06 0939)  Pulse: 127 (06/06 0939)  BP: 148/85 (06/06 0940)  Resp: --  SpO2: 100 % (06/06 0939)  Height: 167.6 cm (5\' 6" ) (06/06 0939)  Weight: 104.5 kg (230 lb 6.1 oz) (06/06 0939)  Gen: In no acute distress  CV: RRR, no murmur, rub, or gallop  Pulm: Clear to auscultation  HEENT: Air bubble with clear fluid. No erythema or bulging of TM. Mild nasal erythema. Clear sclera with no injection. No focal visual deficit noted.      A/P:    Danielle Barber was seen today for other.  Diagnoses and all orders for this visit:  Blurry vision  Not present today mostly during reading. No red flag symptom Dry eye vs needing prescription glasses. Can utilize artificial tear drop if needed.   -     Eye Clinic Referral  Fluid collection of middle ear  Are viral infection. Possible eustachian tube dysfunction. Start azelastine. Reassess on follow up  -     Azelastine Nasal (ASTELIN) 137 mcg (0.1 %) Spray; Instill 2 sprays into EACH nostril 2 times daily for 14 days.  Dispense: 30 mL; Refill: 0  Depressive disorder  Stable. Refill.  -     BuPROPion (WELLBUTRIN XL) 300 mg XL tablet; Take 1 tablet by mouth every morning.  Dispense: 90 tablet; Refill: 0  -     Escitalopram (LEXAPRO) 20 mg tablet; Take 1 tablet by mouth every morning.  Dispense: 90 tablet; Refill: 0  Moderate persistent asthma without complication  Refill.  -     Montelukast (SINGULAIR) 10 mg Tablet; Take 1 tablet by mouth every evening.  Dispense: 90 tablet; Refill: 0  -     Albuterol (PROAIR HFA, PROVENTIL HFA,  VENTOLIN HFA) 90 mcg/actuation inhaler; Take 1-2 puffs by inhalation every 4 hours if needed for wheezing.  Dispense: 8.5 g; Refill: 0  Hypothyroidism, unspecified type  Appears to have fluctuating level. Recheck TSH.   -     LevoTHYROxine 175 mcg Tablet; Take 1 tablet by mouth every morning.  Dispense: 90 tablet; Refill: 0  -     Thyroid Stimulating Hormone; Future  -     Comprehensive Metabolic Panel; Future  -     CBC with Differential; Future  Colon cancer screening  Ordered  -     Fecal by Immunoassay (FIT); Future      Return as needed     Return precaution given     Next visit:  -follow up elevated BP (inform to check at home when not nervous)  -Follow up ear clogged symptom of R ear  -Follow up thyroid function     Cheng-lun Sharia Reeve)  Regino Schultze, MD  [facility]   PI# [phone]

## 2022-10-20 NOTE — Nursing Note (Signed)
Identifiers x 2, Vital signs taken, allergies verified, screened for pain, tobacco history reviewed, preferred pharmacy verified and refills needed no.   Patient had a loop mask on upon rooming and was instructed to continue to wear mask for the rest of the visit. I had a loop mask and gloves during the rooming process of patient care.      Nidia Grogan, LVN

## 2022-10-21 NOTE — Addendum Note (Signed)
Addended by: Andrey Campanile on: 10/21/2022 06:07 PM     Modules accepted: Orders

## 2022-11-23 ENCOUNTER — Other Ambulatory Visit: Payer: Self-pay | Admitting: FAMILY PRACTICE

## 2022-11-23 DIAGNOSIS — H6590 Unspecified nonsuppurative otitis media, unspecified ear: Secondary | ICD-10-CM

## 2022-11-26 NOTE — Telephone Encounter (Signed)
Pharmacy Refill Optimization (PRO)    Per PRO Protocol, approval or denial will be determined by the provider     Meets PRO P&T Approved Protocol: NO - > 18 months since last appt and Prescription was never prescribed by a clinic provider      Never seen in [place] clinic, last provider seen with [place] Earlene Plater was in [place], saw Dr. Regino Schultze   ====================================================================    Provider: Lolita Rieger, MD  Next appointment: 0   Last appointment: Never seen   Medication Name:   Requested Prescriptions     Pending Prescriptions Disp Refills    Azelastine Nasal (ASTELIN) 137 mcg (0.1 %) Spray [Pharmacy Med Name: AZELASTINE 0.1% (137 MCG) SPRY]       Sig: Instill 2 sprays into EACH nostril 2 times daily for 14 days.     Last prescribed date per EMR: 10/20/2022 #30 ml R0  Med last filled: 10/20/2022  Med last reviewed/reconciled: 10/20/2022 [place] provider   Labs (if required by protocol): N/A

## 2022-11-28 ENCOUNTER — Ambulatory Visit: Payer: No Typology Code available for payment source | Admitting: Family Medicine

## 2022-12-01 ENCOUNTER — Ambulatory Visit: Payer: No Typology Code available for payment source | Admitting: Optometrist

## 2023-01-03 NOTE — Progress Notes (Signed)
ENDOCRINOLOGY CONSULTATION NOTE  Date of Consultation: 01/19/2023   Reason of Consultation: hypothyroidism   Primary Care Provider: Lolita Rieger, MD       Assessment   Danielle Barber is a 47yr  old female with a history of hypothyroidism who is referred for consultation and management of hypothyroidism.    Diagnoses and all orders for this visit:  Acquired hypothyroidism  -     Follow up appointment request with PCP  Vitamin D deficiency  Other orders  -     Endocrinology Referral  -     Thyroxine, Free (Free T4); Future  -     Thyroid Stimulating Hormone; Future  -     Vitamin D, 25 Hydroxy; Future    Comment: Patient with long history of primary hypothyroidism.  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  Not aware of TPO Ab and Thyrglobulin Ab results; however, not required to titrate treatment. Not on any goitrogens (amiodarone or lithium). No history of iatrogeic hypothyroidism (thyroid surgery or neck radiation). Thyroid physical exam is unremarkable.   Interestingly, patient had been on a stable dose of levothyroxine until a year ago.  The cause for her TSH fluctuation is not clear at this time.  She is not on any OCP or other estrogen containing medication.  She states her body weight has been stable.  She does not have diarrhea.  Celiac panel was ordered through [facility], and results were normal. Patient does taking levothyroxine with other medications in the morning which may be affecting levothyroxine absorption.       Plan:    1. Reviewed with patient that levothyroxine should be taken   - on an empty stomach, with water only  - wait 1/2 to 1 hour before eating  - wait 2~4 hours before taking calcium, iron or multivitamins (taking at lunch)   2. Re-order thyroid function labs   Will follow up with results and adjust levothyroxine dose as needed.    Will need to recheck TSH/free T4 in 2 months after adjusting levothyroxine dose.     3. For fatigue  will check 25 OHD  may consider sleep study  via PCP office  lifestyle modification for weight control. Previous A1c was normal.     Follow up: patient prefers to follow up with PCP and return to endocrine clinic as needed.    Total time I spent in care of this patient today (excluding time spent on other billable services) was 50 minutes.     This note electronically signed by:  Zada Finders, DNP, FNP-BC  Endocrinology NP Fellow      Attending Addendum:  I have seen and evaluated the patient and performed the key parts of the examination and evaluation. I have reviewed Danielle Soguilon, DNP, FNP-BC's note and amended sections where appropriate.  I agree with the documented findings, assessment and plan of care that we formulated together.     Total time I spent in care of this patient today (excluding time spent on other billable services) was 50 minutes.  This note is electronically signed by: Brantley Fling, MD, IM-Endocrinology, PI# [phone].    Danielle Barber, Danielle Spare, MD        HISTORY  Danielle Barber is a 80yr female with PMH of hypothyroidism, depressive disorder, total hysterectomy, and moderate-persistent asthma. Initially diagnosed with hypothyroidism in 2016 and started on levothyroxine and has been on levothyroxine 137 mcg for several years. on 12/2021, her thyroid function test (TFT) showed elevated TSH  which led to her provider in [place] to increase her regimen from 137 mcg to 175 mcg daily. She has been on  levothyroxine 175 mcg before brief stoppage early this year due to insurance change but has otherwise been consistent at taking her medication.     She reports taking levothyroxine daily 30 minutes before she eats food. However, she notes taking it with her other medications in the morning. She does not take any OCP or estrogen containing medications. She does not take any supplements or vitamins.     Thyroid ROS:  Constitutional: negative for night sweats, fever, unintentional weight gain or loss, and fatigue.    Energy level: poor   Weight  change: no     Sleep: poor    HEENT:negative for neck pain, neck pressure, neck masses, dysphonia, dysphasia, and hoarseness.   Eye Sx:     Pain: no   Proptosis: no   Dry or "gritty" feeling: no   Redness: no   Double vision (primary or secondary gaze): no    YQ:MVHQIONG for palpitations, chest pain, and shortness of breath.  EX:BMWUXLKG for hyperdefecation and constipation.   Appetite: fair  Menses/GYN: hx of .  Skin:negative for diaphoresis.   Hair loss: no        Brittle nails: no  Psych:positive for anxiety and depression.  Neuro:negative for tremor.    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   Benadryl works best   [employer] zyrtec/claritin even claritin D w/o benefit   Nasal steroid, cause sneezing      Depressive disorder 09/17/2004    Insomnia 09/17/2004    Allergies[1]   Past Medical History[2]  Past Surgical History[3] (Not in a hospital admission)     Social History     Occupational History    Not on file   Tobacco Use    Smoking status: Never    Smokeless tobacco: Never   Substance and Sexual Activity    Alcohol use: Not on file    Drug use: Not on file    Sexual activity: Not on file    Family History[4]       OBJECTIVE  Temp: 36.2 C (97.2 F) (09/05 0949)  Temp src: Tympanic (09/05 0949)  Pulse: 92 (09/05 0949)  BP: 133/82 (09/05 0949)  Resp: --  SpO2: 97 % (09/05 0949)  Height: --  Weight: 109 kg (240 lb 4.8 oz) (09/05  0949)  Body mass index is 38.79 kg/m.    Pertinent Physical Exam Findings:  General: Comfortable.  Well-developed and well-nourished. In NAD. A&O x 3.    HEENT:   EOMI. The thyroid is not enlarged and no palpable thyroid nodules are noticed.   CV: Regular rate, regular rhythm.    Respiratory:  Normal  respiratory effort.  Lungs clear to auscultation bilaterally   Abd:  soft, non-tender, no mass or hepatospeenomegaly.   Musculoskeletal:  Gait: normal. Peripheral edema: no.   Neuro: Non-focal. No resting tremor of outstretched hands.    Psych: Appropriate judgment and insight.  Appropriate mood and affect.    Pertinent interpretation of labs and imaging related to medical decision making  I have reviewed recent labs:   Lab Results   Lab Name Value Date/Time    TSH 7.81 (H) 10/20/2022 10:19 AM    TSH 2.5 03/09/2022 03:27 PM    TSH 11.0 (H) 01/11/2022 01:12 PM    TSH 8.1 (H) 11/22/2021 12:46 PM    FT4 1.49 10/20/2022 10:19 AM    FT4 1.3 03/09/2022 03:27 PM      Latest Reference Range & Units 10/20/22 10:19   Protein 6.6 - 8.7 g/dL 7.8       I have reviewed recent imaging studies:   Did not see thyroid US study data    Pertinent discussion and/or review from another source:  Previous endocrinologist note from [place].       Note: part of this chart was created in part with Tax adviser.  Every effort has been made to correct any errors in the voice-recognition / dictation, but some may have been missed.  If there are any questions, please contact the author for clarification and revision if needed.  Patient on stable thyroid replacement regimen with TSH wnl. Patient to continue current thyroid replacement medication and may return to primary care for continued management and medication refills.    Additional Recommendations (if any):    Dear Dr. Leary Roca, Danielle Barber    Just saw her TSH result- 5.38 and 25 OHD 11.    Would recommend   increasing from Levothyroxine 175 mcg daily to 175 mcg daily except  1.5 tabs on Sundays (average 187 mcg).   starting vitamin D3 at 2000 international unit(s) daily  I will send her mychart message about this and order to repeat TFT/vitamin D3  in 2 months.   I will advise her to make a follow up visit with you in 4 months.     Please let me know if you have any questions or concerns about my clinical evaluation.   Sincerely,    Danielle Barber    Consider return to endocrinology if TSH is >7 or <0.1 at least 2 consecutive times.    Return to PCP for hypothyroid management as directed in the AVS.       [1]   Allergies  Allergen Reactions    Cephalexin Rash   [2] No past medical history on file.  [3] No past surgical history on file.  [4]   Family History  Problem Relation Name Age of Onset    Thyroid Maternal Grandmother      Thyroid Paternal Grandmother

## 2023-01-19 ENCOUNTER — Encounter: Payer: Self-pay | Admitting: Internal Medicine

## 2023-01-19 ENCOUNTER — Ambulatory Visit: Payer: No Typology Code available for payment source

## 2023-01-19 ENCOUNTER — Ambulatory Visit: Payer: No Typology Code available for payment source | Attending: Internal Medicine | Admitting: Internal Medicine

## 2023-01-19 VITALS — BP 133/82 | HR 92 | Temp 97.2°F | Wt 240.3 lb

## 2023-01-19 DIAGNOSIS — E039 Hypothyroidism, unspecified: Secondary | ICD-10-CM | POA: Insufficient documentation

## 2023-01-19 DIAGNOSIS — H524 Presbyopia: Secondary | ICD-10-CM | POA: Insufficient documentation

## 2023-01-19 DIAGNOSIS — H5213 Myopia, bilateral: Secondary | ICD-10-CM

## 2023-01-19 DIAGNOSIS — H538 Other visual disturbances: Secondary | ICD-10-CM

## 2023-01-19 DIAGNOSIS — H0288B Meibomian gland dysfunction left eye, upper and lower eyelids: Secondary | ICD-10-CM

## 2023-01-19 DIAGNOSIS — H0288A Meibomian gland dysfunction right eye, upper and lower eyelids: Secondary | ICD-10-CM

## 2023-01-19 DIAGNOSIS — E559 Vitamin D deficiency, unspecified: Secondary | ICD-10-CM | POA: Insufficient documentation

## 2023-01-19 LAB — THYROXINE, FREE (FREE T4): Thyroxine, Free (Free T4): 1.49 ng/dL (ref 0.92–1.68)

## 2023-01-19 LAB — VITAMIN D, 25 HYDROXY: Vitamin D, 25 Hydroxy: 11 ng/mL (ref 10.0–50.0)

## 2023-01-19 LAB — THYROID STIMULATING HORMONE: Thyroid Stimulating Hormone: 5.38 u[IU]/mL — ABNORMAL HIGH (ref 0.27–4.20)

## 2023-01-19 NOTE — Patient Instructions (Addendum)
Summary of Plan:   Re-order thyroid function labs.  Will follow up with results and provided recommendation depending on result.      Follow up: prefer follow up with PCP  Date: 01/20/2023  Dear Danielle Barber,   It was a pleasure to see you in the Endocrinology clinic. I am happy to see you are doing well. Your hypothyroidism is stable on your current medications, and you do not need a return visit to the Endocrinology clinic. I am confident your primary care doctor (PCP), can continue to monitor and manage the Hypothyroidism.   This letter summarizes the information we discussed about your follow-up with your PCP to manage the Hypothyroidism.  During this visit, your PCP  will discuss the condition, treatment plan, check lab tests, and refill medications  If you are taking levothyroxine, please take it on an empty stomach with water only, wait 30-60 minutes before eating, and wait 2-4 hours before taking calcium, iron, or a multivitamin.   Your PCP will check the following blood tests every 6-12 months:   Thyroid stimulating hormone (TSH) and Thyroxine (free T4). Please avoid taking Biotin for at least 3 days prior to the blood draw to avoid interference with the lab results.     If there are any changes regarding the Hypothyroidism, your PCP can discuss this with me or at their recommendation you can call to schedule a follow-up visit in the clinic without requiring a new referral.  Sincerely,  Brantley Fling, MD

## 2023-01-19 NOTE — Progress Notes (Signed)
If you are reviewing this progress note and have questions on its meaning or medical terms, please discuss at our clinic visit. The purpose of a chart note is to convey the pertinent medical information as concisely and thoroughly as possible for our medical colleagues and thus there may be esoteric medical terms and acronyms.     [facility]  Optometry Service Note       REASON FOR VISIT:    Danielle Barber is a 47yr old female and is being seen for eye exam.   Reading is hard in the morning and evenings even with glasses on. Does not use glasses all day. Using progressive lenses       PPE used by provider during encounter: Surgical mask     PAST OCULAR HISTORY:  (-) Ocular Surgery/Trauma       FAMILY OCULAR HISTORY:  Glaucoma - grandfather  Retinal detachment - mother  (-) macular degeneration      CURRENT OCULAR MEDS:  Allergy drops as needed     ALLERGIES:   Cephalexin    PAST MEDICAL HISTORY:  Patient Active Problem List   Diagnosis    Allergic rhinitis    Depressive disorder    Hx of total hysterectomy    Hypothyroidism    Insomnia    Moderate persistent asthma    Sleep-related movement disorder    Unspecified fracture of shaft of left fibula, initial encounter for closed fracture    Dyspnea and respiratory abnormality       REVIEW OF SYSTEMS: All systems reviewed with patient and pertinent history recorded.    EXAMINATION:  The patient appears alert and oriented times 3.   See exam module.    ---------------------------------------------------------------------  IMPRESSION/PLAN:  1. Myopia with presbyopia of both eyes  - New GL Rx, progressive lenses   - Discussed findings. Stable distance Rx, increase in reading    2. Meibomian gland dysfunction (MGD), bilateral, both upper and lower lids  - Monitor  - Artificial tears as needed OU      Follow-up  Return in about 1 year (around 01/19/2024).      Findings were discussed with the patient and questions were answered to the patient's  satisfaction. She indicates understanding of these issues and agrees with the plan.    Electronically signed by:     Sherrilyn Rist, OD  Optometrist   [facility]

## 2023-01-19 NOTE — Nursing Note (Signed)
Patient identified x 2; chief complaint identified. allergies verified, vital signs taken, pain screened, tobacco use screened, health maintenance reviewed, pharmacy confirmed.   Kristina Bejinez, LVN

## 2023-01-20 ENCOUNTER — Other Ambulatory Visit: Payer: Self-pay | Admitting: Internal Medicine

## 2023-01-20 ENCOUNTER — Encounter: Payer: Self-pay | Admitting: Internal Medicine

## 2023-01-20 DIAGNOSIS — E559 Vitamin D deficiency, unspecified: Secondary | ICD-10-CM

## 2023-01-20 DIAGNOSIS — E039 Hypothyroidism, unspecified: Secondary | ICD-10-CM

## 2023-01-20 MED ORDER — LEVOTHYROXINE 175 MCG TABLET: ORAL_TABLET | ORAL | 1 refills | Status: DC

## 2023-01-22 ENCOUNTER — Other Ambulatory Visit: Payer: Self-pay | Admitting: FAMILY PRACTICE

## 2023-01-22 DIAGNOSIS — J4540 Moderate persistent asthma, uncomplicated: Secondary | ICD-10-CM

## 2023-01-23 ENCOUNTER — Ambulatory Visit: Payer: No Typology Code available for payment source | Admitting: FAMILY PRACTICE

## 2023-01-23 ENCOUNTER — Encounter: Payer: Self-pay | Admitting: FAMILY PRACTICE

## 2023-01-23 VITALS — BP 116/82 | HR 94 | Temp 97.0°F | Resp 16 | Ht 66.0 in | Wt 238.3 lb

## 2023-01-23 DIAGNOSIS — E039 Hypothyroidism, unspecified: Secondary | ICD-10-CM

## 2023-01-23 DIAGNOSIS — F32A Depression, unspecified: Secondary | ICD-10-CM

## 2023-01-23 DIAGNOSIS — E559 Vitamin D deficiency, unspecified: Secondary | ICD-10-CM

## 2023-01-23 DIAGNOSIS — Z13228 Encounter for screening for other metabolic disorders: Secondary | ICD-10-CM

## 2023-01-23 DIAGNOSIS — R41840 Attention and concentration deficit: Secondary | ICD-10-CM | POA: Insufficient documentation

## 2023-01-23 DIAGNOSIS — G4700 Insomnia, unspecified: Secondary | ICD-10-CM

## 2023-01-23 DIAGNOSIS — J4540 Moderate persistent asthma, uncomplicated: Secondary | ICD-10-CM

## 2023-01-23 DIAGNOSIS — L309 Dermatitis, unspecified: Secondary | ICD-10-CM

## 2023-01-23 MED ORDER — ALBUTEROL SULFATE HFA 90 MCG/ACTUATION AEROSOL INHALER: 1.0000 | INHALATION_SPRAY | RESPIRATORY_TRACT | 0 refills | Status: DC | PRN

## 2023-01-23 MED ORDER — MONTELUKAST 10 MG TABLET: 10.0000 mg | ORAL_TABLET | Freq: Every day | ORAL | 3 refills | Status: AC

## 2023-01-23 MED ORDER — ESCITALOPRAM 20 MG TABLET
20.0000 mg | ORAL_TABLET | Freq: Every day | ORAL | 3 refills | Status: DC
Start: 2023-01-23 — End: 2024-02-07

## 2023-01-23 MED ORDER — BUPROPION HCL XL 300 MG 24 HR TABLET, EXTENDED RELEASE
300.0000 mg | EXTENDED_RELEASE_TABLET | Freq: Every day | ORAL | 3 refills | Status: DC
Start: 2023-01-23 — End: 2024-02-07

## 2023-01-23 NOTE — Nursing Note (Signed)
Danielle Barber has been verified by full name and DOB, vital obtained/documented, allergies verified, pharmacy confirmed.      Joanie Coddington, MA

## 2023-01-23 NOTE — Progress Notes (Signed)
S: 47yo F with PMH of hypothyroid, depression, moderate asthma, insomnia present for establishing care    Medical:  #Skin lesions  -pruritic first started at left ankle was prescribed topical steroid however the lesion kept on coming back   -finger lesion started few week  -hx of rosacea which facial redness has not gotten worse    #Hypothyroid  -adjusted dose pending repeat     #Vitamin d deficiency  -started on 2000 U pending repeat     Medication:  reviewed  Allergy:  Allergies[1]    Surgery:  Past Surgical History[2]      Fhx:   Family History[3]        Shx:   Social History     Social History Narrative    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     2 dogs    Diet:  "could be better"     Exercise: Minimal     Sleep: trouble falling asleep, working on sleep hygeiene     Tobacco Use History[4]  Social History     Substance and Sexual Activity   Alcohol Use Yes    Alcohol/week: 4.0 standard drinks of alcohol    Types: 4 Cans of beer per week             Current Outpatient Medications on File Prior to Visit:     Azelastine Nasal (ASTELIN) 137 mcg (0.1 %) Spray    Inhalational Spacing Device (AEROCHAMBER PLUS-FLOW SIGNAL) Spacer    LevoTHYROxine 175 mcg Tablet    O:  Temp: 36.1 C (97 F) (09/09 0941)  Temp src: --  Pulse: 94 (09/09 0941)  BP: 116/82 (09/09 0941)  Resp: 16 (09/09 0941)  SpO2: 97 % (09/09 0941)  Height: 167.6 cm (5\' 6" ) (09/09 0941)  Weight: 108.1 kg (238 lb 5.1 oz) (09/09 0941)  Gen: In no acute distress, pleasant  HEENT: NCAT,EOMI,    A/P:  Danielle Barber was seen today for establish care.  Diagnoses and all orders for this visit:  Dermatitis  Recurrent dermatitis refractory to topical steroid. Very pruritic.  -     DERMATOLOGY CLINIC REFERRAL  Depressive disorder  Continue current regimen  Overview:  -has been on escitalopram and bupropion for many years. Symptom is stable   Orders:  -     BuPROPion (WELLBUTRIN XL) 300 mg XL tablet; Take 1 tablet by mouth every morning.  Dispense:  90 tablet; Refill: 3  -     Escitalopram (LEXAPRO) 20 mg tablet; Take 1 tablet by mouth every morning.  Dispense: 90 tablet; Refill: 3  Moderate persistent asthma without complication  Stable continue current regimen  Overview:  -takes montelukast only uses albuterol x2 per month  Orders:  -     Montelukast (SINGULAIR) 10 mg Tablet; Take 1 tablet by mouth every evening.  Dispense: 90 tablet; Refill: 3  -     Albuterol (PROAIR HFA, PROVENTIL HFA, VENTOLIN HFA) 90 mcg/actuation inhaler; Take 1-2 puffs by inhalation every 4 hours if needed for wheezing.  Dispense: 8.5 g; Refill: 0  Insomnia, unspecified type  Patient to work on sleep hygiene  Overview:  -years ago tried [place] and Ambien but intolerant to side effect   Hypothyroid  Pending repeat TSH after dose adjustment  Vitamin D deficiency  Taking 2000 U daily   Inattention  Given ADHD questionnaire. Patient to bring to next visit to discuss    Next visit:  -follow up hypothyroid  -  follow up lifestyle changes  -follow up inattention    Cheng-lun Sharia Reeve)  Regino Schultze, MD  [facility]   PI# [phone]           [1]   Allergies  Allergen Reactions    Cephalexin Rash   [2]   Past Surgical History:  Procedure Laterality Date    HYSTERECTOMY      LOWER EXTREMITY BONE SURGERY     [3]   Family History  Problem Relation Name Age of Onset    Retinal Detachment Mother      Thyroid Maternal Grandmother H. Rice     Ovarian Cancer Maternal Grandmother H. Rice     Thyroid Paternal Grandmother Lyn Henri     Alcohol Abuse Paternal Annabelle Harman    [4]   Social History  Tobacco Use   Smoking Status Former    Current packs/day: 0.00    Average packs/day: 1 pack/day for 10.0 years (10.0 ttl pk-yrs)    Types: Cigarettes    Start date: 12/14/1992    Quit date: 10/14/2001    Years since quitting: 21.2   Smokeless Tobacco Never

## 2023-01-23 NOTE — Assessment & Plan Note (Signed)
Recheck TSH on next lab work

## 2023-01-23 NOTE — Telephone Encounter (Signed)
Discussed during visit.     Danielle Barber)  Danielle Schultze, MD  [facility]   PI# [phone]

## 2023-01-26 ENCOUNTER — Encounter: Payer: Self-pay | Admitting: Internal Medicine

## 2023-01-26 NOTE — Telephone Encounter (Signed)
Pharmacy Refill Optimization (PRO)    REFILL REQUEST ALREADY RESPONDED TO BY OTHER MEANS     Prescription renewal addressed on 01/23/23, sent to [facility]    Refill Request denied as DUPLICATE per PRO Workflow 01/26/2023

## 2023-02-16 ENCOUNTER — Ambulatory Visit: Payer: No Typology Code available for payment source | Admitting: Dermatology

## 2023-02-16 ENCOUNTER — Encounter: Payer: Self-pay | Admitting: Dermatology

## 2023-02-16 VITALS — BP 109/74 | HR 91 | Temp 98.0°F | Resp 16 | Ht 66.0 in | Wt 241.0 lb

## 2023-02-16 DIAGNOSIS — L719 Rosacea, unspecified: Secondary | ICD-10-CM

## 2023-02-16 DIAGNOSIS — L409 Psoriasis, unspecified: Secondary | ICD-10-CM

## 2023-02-16 MED ORDER — DOXYCYCLINE HYCLATE 100 MG CAPSULE
ORAL_CAPSULE | ORAL | 0 refills | Status: AC
Start: 2023-02-16 — End: 2024-02-15

## 2023-02-16 MED ORDER — FLUOCINONIDE 0.05 % TOPICAL OINTMENT
TOPICAL_OINTMENT | TOPICAL | 3 refills | Status: AC
Start: 2023-02-16 — End: 2024-02-15

## 2023-02-16 MED ORDER — METRONIDAZOLE 0.75 % TOPICAL CREAM
TOPICAL_CREAM | Freq: Two times a day (BID) | TOPICAL | 0 refills | Status: AC
Start: 2023-02-16 — End: 2024-02-11

## 2023-02-16 NOTE — Progress Notes (Signed)
Visit Date: 02/16/2023    New visit for Danielle Barber (DOB: 11-25-1975), a 47yr old female.    Chief complaint:  Rash   History obtained from: Patient      Dx: Favor Psoriasis  Hx:    Pt reports an itchy spot on the left ankle, hands, elbows, and fingers; Current Treatment: Clobetasol 0.05% Cream (Effective. But when she stops, rash will return); Notes the rash on the ankle is near a scar;   Exam:  5 cm oval-shaped erythematous, scaly plaque present on the left lateral ankle. 1x nail pitting on the left fifth digit.   Assessment:  Psoriasis chronic flaring  Plan:  Discussed diagnosis and treatment options. Patient is in agreement to plan.  - Informed patient there is no cure for this condition.  -Start Lidex 0.05% Ointment QHS on affected areas. Apply and wrap with saran wrap.   - moisturizer in day time.  - RTC in 3 months    Dx: Papulopustular Rosacea   Hx:    Pt reports redness of the face; Denies allergies to any medication; Exercise exacerbates rosacea; Notes her skin is dry  Exam:  Inflammation and papules on the nose, cheeks, forehead, and around the mouth. With underlying telangiectasia.   Assessment:  Papulopustular Rosacea chronic flaring  Plan:  Discussed diagnosis and treatment options. Patient is in agreement to plan.  - Start Doxycycline 100 mg BID for 2 months then decrease to 50 mg BID for 1 month with meal but avoid dairy 1 hour before or after taking   - Start Metronidazole 0.75% Cream BID on the face   - RTC in 3 months        Hx of Skin Cancer  -    Fx of Skin Cancer  -    Diagnoses and problems below this line are listed for historical purposes and were not evaluated, discussed, or treated  ----------------------------------------------------------------------------------------------------------------    Return Visit: 3 months or as needed      Problem List:   Patient Active Problem List    Diagnosis Date Noted    Hx of total hysterectomy 02/17/2020     Unspecified fracture of shaft of left fibula, initial encounter for closed fracture 12/31/2015    Hypothyroidism 01/29/2015    Moderate persistent asthma 05/25/2009     -takes montelukast only uses albuterol x2 per month      Sleep-related movement disorder 06/23/2005    Dyspnea and respiratory abnormality 06/23/2005    Allergic rhinitis 09/17/2004     Formatting of this note might be different from the original.   Benadryl works best   [employer] zyrtec/claritin even claritin D w/o benefit   Nasal steroid, cause sneezing      Depressive disorder 09/17/2004     -has been on escitalopram and bupropion for many years. Symptom is stable   -seeing therapist       Insomnia 09/17/2004     -years ago tried [place] and Ambien but intolerant to side effect           Meds:  List in EMR viewed today    Allergies:    List in EMR viewed today    Labs Reviewed:  -    Records Reviewed:   None    Areas Examined:  problem-focused      SCRIBE DISCLAIMER:   I, Lovie Chol, a trained medical scribe scribed this note in the presence of Vladimir Faster, M.D.      Electronically Signed By:  Lovie Chol, SCRIBE  02/16/2023  4:33 PM       PROVIDER DISCLAIMER:  This document serves as my personal record of services that I personally performed and was taken in my presence. It was created on  02/16/2023  by the medical scribe noted above.      I have reviewed this document and agree that this note accurately and completely reflects the history and exam findings, the patient care provided, and my medical decision making.     Electronically Signed By:     Vladimir Faster, M.D.  [facility] Department of Dermatology

## 2023-02-16 NOTE — Nursing Note (Signed)
Patient identified using two identifiers. Chief complaint identified, vital signs taken, tobacco history screened, and allergies reviewed.    Danielle Gillingham, MA

## 2023-03-02 ENCOUNTER — Other Ambulatory Visit: Payer: Self-pay | Admitting: FAMILY PRACTICE

## 2023-03-02 DIAGNOSIS — J4540 Moderate persistent asthma, uncomplicated: Secondary | ICD-10-CM

## 2023-03-03 NOTE — Telephone Encounter (Signed)
Pharmacy Refill Optimization (PRO)    Refill authorized per P&T Protocol 03/03/2023    Bypassing Epic Protocol Determination (See Protocol Details for specific information)     Meets PRO P&T Approved Protocol: YES      Due for a refill    ====================================================================    Medication name: albuterol inhaler  Labs (if required by protocol): N/A

## 2023-03-29 ENCOUNTER — Ambulatory Visit: Payer: No Typology Code available for payment source | Admitting: FAMILY PRACTICE

## 2023-05-24 ENCOUNTER — Ambulatory Visit: Payer: No Typology Code available for payment source | Admitting: FAMILY PRACTICE

## 2023-06-05 ENCOUNTER — Ambulatory Visit: Payer: No Typology Code available for payment source | Admitting: Emergency Medicine

## 2023-06-05 DIAGNOSIS — R0981 Nasal congestion: Secondary | ICD-10-CM

## 2023-06-05 DIAGNOSIS — H6990 Unspecified Eustachian tube disorder, unspecified ear: Secondary | ICD-10-CM

## 2023-06-05 NOTE — Patient Instructions (Signed)
Here is what I do when I have similar symptoms.     Sinus rinse and blow nose.   Affrin (generic oxymetazoline) spray. Wait 10 minutes.   Repeat sinus rinse.   Fluticasone and azelastine spray  Can repeat sinus rinse sprays every few hours  Salt water gargles particularly before bed.   Chew gum, suck on lozenges, and gently blow nose while holding it

## 2023-06-05 NOTE — Progress Notes (Signed)
EXPRESS CARE PROVIDER NOTE   Date of Service: 06/05/2023 Patient's PCP: Andrey Campanile   Note Started: 06/05/2023 9:13 AM DOB: [birth year]            HISTORY  The history provided by the patient.  Interpreter used: No    HPI: 48yr old female, who has past medical history below, presents with congestion.     Started 1/15. Partner tested positive for Covid. She did not test. No fever.     Feeling better but sinus congestion and ear pressure persist. Mild SOB but better with inhaler.     Did a sinus rinse and took diphenhydramine.     Past Medical History[1]  Problem List[2]    PHYSICAL  Constitutional:       General: Patient is not in acute distress.     Appearance: Normal appearance. Patient is not ill-appearing or toxic-appearing.   Pulmonary:      Effort: No respiratory distress.   Neurological:      Mental Status: Patient is alert.   Psychiatric:         Mood and Affect: Mood normal.         Behavior: Behavior normal.              PATIENT SUMMARY:  31yr female with ETD and sinus congestion. Likely viral and probably covid. Out of window for paxlovid. Advised sinus care and may increase use of albuterol as needed. Additional verbal discharge instructions including time to follow-up, whom to follow-up with, reasons to return, and possible medication side effects discussed with patient.       Diagnoses and all orders for this visit:  Dysfunction of Eustachian tube, unspecified laterality  Nasal sinus congestion      Informed they can call back for further care. Care precautions discussed and all questions answered.     Video Visit Note   I performed this clinical encounter by utilizing a real-time telehealth video connection between my location and the patient's location. The patient's location was confirmed during this visit. I obtained verbal consent from the patient to perform this clinical encounter utilizing video and prepared the patient by answering any questions they had about the telehealth  interaction.        Electronically signed:  Kandice Moos, MD - Attending Physician  [facility]         [1]   Past Medical History:  Diagnosis Date    Anxiety     Asthma     Depression     Disease of thyroid gland     Fracture     Inattention 01/23/2023   [2]   Patient Active Problem List  Diagnosis    Allergic rhinitis    Depressive disorder    Hx of total hysterectomy    Hypothyroidism    Insomnia    Moderate persistent asthma    Sleep-related movement disorder    Unspecified fracture of shaft of left fibula, initial encounter for closed fracture    Dyspnea and respiratory abnormality

## 2023-06-08 ENCOUNTER — Ambulatory Visit: Payer: No Typology Code available for payment source | Admitting: Dermatology

## 2023-06-19 ENCOUNTER — Ambulatory Visit: Payer: Self-pay | Admitting: FAMILY PRACTICE

## 2023-06-19 NOTE — Telephone Encounter (Signed)
3 patient identifiers used.  Per:   patient.    Disposition: SEE PCP WITHIN 3 DAYS   Appointment given per protocol -no NP OV available in home clinic  Date & Time  06/20/2023 10:30 AM Provider  Tawni Millers, MD Department  [facility] Internal Medicine     Per:   patient verbalizes agreement to plan. Agrees to callback with any increase in symptoms/concerns or questions.    See Assessment Below  Pt has had congestion for past three to four weeks. Pt states she was ill with Covid starting on the second week of January and has had nasal and ear congestion ever since. Pt has tried the suggested medications from Telehealth visit on 1/20 with no relief. Advised to call back for worsening symptoms or further concerns.       See Care Advice Below  Care Advice            Ear - Lindon Romp, RN Mon Jun 19, 2023 08:05 AM      Care Advice       SEE PCP WITHIN 3 DAYS:  * You need to be seen within 2 or 3 days.  * PCP VISIT: Call your doctor (or NP/PA) during regular office hours and make an appointment. A clinic or urgent care center are good places to go for care if your doctor's office is closed or you can't get an appointment. NOTE: If office will be open tomorrow, tell caller to call then, not in 3 days.  * IF PATIENT HAS NO PCP: A clinic or urgent care center are good places to go for care if you do not have a primary care provider. NOTE: Try to help caller find a PCP for future care (e.g., use a physician referral line). Having a PCP or 'medical home' means better long-term care.    CALL BACK IF:  * Severe ear pain occurs  * You become worse                            See Protocol and Disposition Below  Reason for Disposition   Ear congestion present > 48 hours    Additional Information   Negative: Earache persists > 1 hour   Negative: Pus or cloudy discharge from ear canal    Protocols used: Ear - Congestion-ADULT-AH

## 2023-06-20 ENCOUNTER — Ambulatory Visit: Payer: No Typology Code available for payment source | Admitting: Internal Medicine

## 2023-06-20 ENCOUNTER — Encounter: Payer: Self-pay | Admitting: Internal Medicine

## 2023-06-20 VITALS — BP 135/77 | HR 90 | Temp 97.6°F | Resp 24 | Wt 241.6 lb

## 2023-06-20 DIAGNOSIS — J310 Chronic rhinitis: Secondary | ICD-10-CM

## 2023-06-20 DIAGNOSIS — R0981 Nasal congestion: Secondary | ICD-10-CM

## 2023-06-20 DIAGNOSIS — H6993 Unspecified Eustachian tube disorder, bilateral: Secondary | ICD-10-CM

## 2023-06-20 MED ORDER — PREDNISONE 20 MG TABLET
40.0000 mg | ORAL_TABLET | Freq: Every day | ORAL | 0 refills | Status: AC
Start: 2023-06-20 — End: 2023-06-25

## 2023-06-20 MED ORDER — FLUTICASONE PROPIONATE 50 MCG/ACTUATION NASAL SPRAY,SUSPENSION: 2.0000 | Freq: Every day | NASAL | 5 refills | Status: DC

## 2023-06-20 NOTE — Progress Notes (Signed)
ASSESSMENT/PLAN FOR Danielle Barber:    Assessment & Plan  [phone]) Dysfunction of both eustachian tubes  (primary encounter diagnosis)  (R09.81) Nasal sinus congestion  (J31.0) Rhinitis, unspecified type    Comment: Post-viral Rhinosinusitis  Persistent nasal and sinus congestion following a probable COVID-19 infection. No signs of bacterial infection. Eustachian tube dysfunction causing ear congestion and muffled hearing. No ear pain or drainage.  Plan: Fluticasone (FLONASE) 50 mcg/actuation nasal         spray, PredniSONE (DELTASONE) 20 mg Tablet  Counseled and discussed with patient, see below.  Medication side effects reviewed; Side effects of steroids discussed including affects on mental status, alteration in mood, sleeping difficulties/insomnia, elevations in blood pressure and glucose levels, AVN of the hips, increased risk of osteoporosis, fracture risks, infection, skin atrophy, tendon rupture; patient voices understanding and accepts treatment plan.  -Start Flonase, 1-2 sprays in each nostril twice daily for the first five days, then reduce to once daily.  -Start Prednisone 40mg  once daily in the morning for five days. Consider extending to a lower dose if symptoms improve but persist without side effects.    Asthma  Morning wheezing reported, but no worsening of symptoms or exacerbation.  -Continue current inhaler regimen.    -Provide work excuse note for today's date.  -Follow-up as needed.           SUBJECTIVE: Danielle Barber is a 48yr old female with PMH:    Patient Active Problem List    Diagnosis Date Noted    Hx of total hysterectomy 02/17/2020    Unspecified fracture of shaft of left fibula, initial encounter for closed fracture 12/31/2015    Hypothyroidism 01/29/2015    Moderate persistent asthma 05/25/2009    Sleep-related movement disorder 06/23/2005    Dyspnea and respiratory abnormality 06/23/2005    Allergic rhinitis 09/17/2004    Depressive disorder 09/17/2004    Insomnia  09/17/2004      CC: ear, nasal sinus congestion    06/05/23 Telemedicine visit for ETD, sinus congestion felt due to viral infection, Covid. Rx sinus care, increase Albuterol. Symptoms started 05/31/23, partner (+)Covid. Noted sinus congestion, ear pressure.     History of Present Illness  The patient, with a history of asthma, presents with persistent nasal and sinus congestion that has been ongoing for the entire month of January.   The patient believes the symptoms started with a COVID-19 infection, which was not confirmed with a test but was assumed due to her partner testing positive. The patient has been using sinus rinses, azelastine, and Afrin for symptom relief, but the congestion has worsened and spread to the ears. The patient denies any ear pain or drainage but reports muffled hearing due to the congestion. The patient also reports a slight cough, which she attributes to her asthma. The patient denies any sore throat, fevers, chills, or sweats. The patient's sleep has been affected due to difficulty breathing through her nose when lying down. The patient denies any other symptoms such as nausea, vomiting, diarrhea, or headaches but does report feeling a little woozy or lightheaded at times.      Allergies: Cephalexin     Review of Systems   Constitutional:  Positive for fatigue. Negative for chills, diaphoresis and fever.   HENT:  Positive for congestion, hearing loss, postnasal drip, rhinorrhea and sinus pressure. Negative for ear discharge, ear pain, sinus pain, sore throat and tinnitus.    Respiratory:  Positive for cough and wheezing. Negative for shortness of breath.  Cardiovascular:  Negative for chest pain.   Gastrointestinal:  Negative for abdominal pain, diarrhea, nausea and vomiting.   Neurological:  Positive for light-headedness. Negative for dizziness and headaches.       I did review patient's past medical and family/social history, no changes noted.     Medication: medication reconciliation  was performed today.    OBJECTIVE: BP 135/77 (SITE: left arm, Cuff Size: large)   Pulse 90   Temp 36.4 C (97.6 F) (Temporal)   Resp 24   Wt 109.6 kg (241 lb 10 oz)   SpO2 99%   BMI 39.00 kg/m    Physical Exam  Constitutional:       Appearance: She is not ill-appearing, toxic-appearing or diaphoretic.   HENT:      Right Ear: Ear canal and external ear normal. No drainage, swelling or tenderness. A middle ear effusion is present. There is no impacted cerumen. No mastoid tenderness. No hemotympanum. Tympanic membrane is bulging. Tympanic membrane is not injected, perforated or erythematous.      Left Ear: Ear canal and external ear normal. No drainage, swelling or tenderness. A middle ear effusion is present. There is no impacted cerumen. No mastoid tenderness. No hemotympanum. Tympanic membrane is bulging. Tympanic membrane is not injected, perforated or erythematous.      Nose: Mucosal edema, congestion and rhinorrhea present.      Right Nostril: No epistaxis.      Left Nostril: No epistaxis.      Right Turbinates: Swollen and pale.      Left Turbinates: Swollen and pale.      Right Sinus: No maxillary sinus tenderness or frontal sinus tenderness.      Left Sinus: No maxillary sinus tenderness or frontal sinus tenderness.      Mouth/Throat:      Pharynx: Oropharynx is clear. No posterior oropharyngeal erythema.   Eyes:      General: No scleral icterus.     Conjunctiva/sclera: Conjunctivae normal.   Neck:      Vascular: No carotid bruit.   Cardiovascular:      Rate and Rhythm: Normal rate and regular rhythm.      Pulses: Normal pulses.      Heart sounds: Normal heart sounds.   Pulmonary:      Effort: Pulmonary effort is normal. No respiratory distress.      Breath sounds: Normal breath sounds. No wheezing, rhonchi or rales.   Lymphadenopathy:      Cervical: No cervical adenopathy.   Neurological:      Mental Status: She is alert and oriented to person, place, and time.   Psychiatric:         Speech: Speech  normal.         Behavior: Behavior normal. Behavior is cooperative.       See assessment and plan above.     Barriers to Learning assessed: none. Patient verbalizes understanding of teaching and instructions.     Medication safety/instructions discussed with patient/or caregiver.  Informed consent, Risks/Benefits of treatment plan discussed with patient/or caregiver.  See orders and instructions.     Patient WAS wearing a surgical mask  Contact and Droplet precautions were followed when caring for the patient.   PPE used by provider during encounter: Surgical mask and Gloves    This chart may have been created in part with Dragon voice-dictation software.  Efforts have been made to correct errors in voice-recognition and dictation, but some may have been missed.  If there  are any questions, please contact the author for clarification and revision if needed.    I obtained verbal consent from the patient to use AI ambient technology to transcribe the interactions between the patient and myself during the clinical encounter.    If you are reviewing this progress note and have questions about the meaning or medical terms being used, please schedule an appointment or bring it up at your next follow-up appointment. Medical notes are meant to be a communication tool between medical professionals and require medical terms to be used for efficiency.     Electronically signed by:  Candise Bowens. Cherly Hensen, M.D.   Associate Physician  Internal Medicine  [facility]

## 2023-06-20 NOTE — Nursing Note (Signed)
 Vital signs taken, allergies verified, screened for pain, verify pharmacy.    Contact precautions were followed when caring for the patient.   PPE used by provider during encounter: Surgical mask and Face Shield      Theadora Noyes, MA

## 2023-07-04 IMAGING — MR MRI BRAIN WITHOUT CONTRAST
12 series · 48 of 48 positions shown · non-contrast
Comparison: none

FINAL REPORT:
HISTORY: Migraine headache without Dol.
MRI brain without contrast.
TECHNIQUE: Multiplanar-multisequence MR imaging obtained of the brain without contrast.

[Series 1: survey · sagittal · 1.6mm · 1.62mm/px · 14 of 125 slices shown]
[im 1/125]
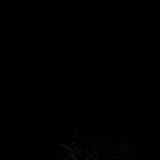
[im 10/125]
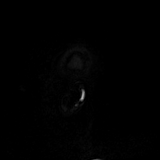
[im 20/125]
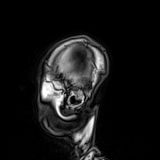
[im 29/125]
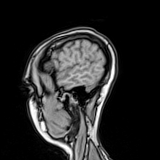
[im 39/125]
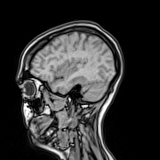
[im 48/125]
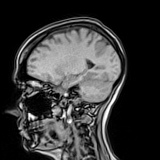
[im 58/125]
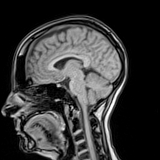
[im 67/125]
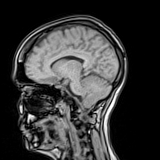
[im 77/125]
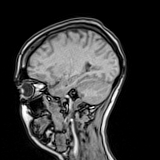
[im 86/125]
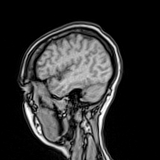
[im 96/125]
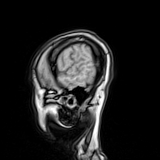
[im 105/125]
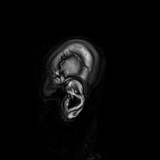
[im 115/125]
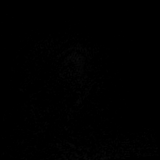
[im 125/125]
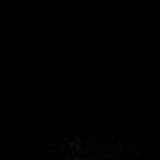

[Series 2: survey_mpr_sag · sagittal · 1.6mm · 1.60mm/px · 1 of 5 slices shown]
[im 1/5]
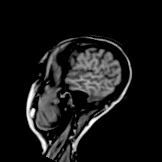

[Series 3: survey_mpr_cor · coronal · 1.6mm · 1.60mm/px · 1 of 3 slices shown]
[im 1/3]
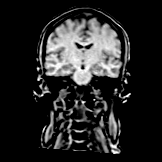

[Series 4: survey_mpr_(person_name) · axial · 1.6mm · 1.60mm/px · 1 of 3 slices shown]
[im 1/3]
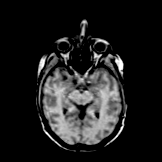

[Series 5: T1 · sagittal · 4.0mm · 0.72mm/px · 3 of 25 slices shown (1 of 2)]
[im 1/25]
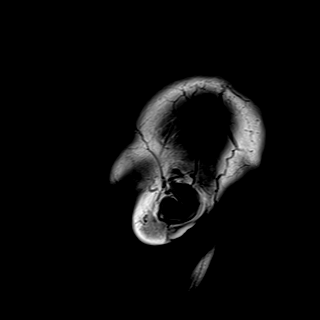
[im 13/25]
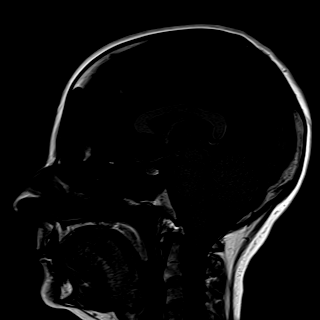
[im 25/25]
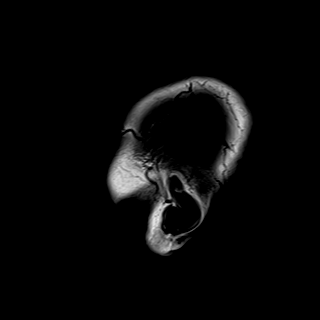

[Series 6: ep2d_diff_(id)_trace_p2_tracew · axial · 4.0mm · 0.60mm/px · z∈[-64,+84]mm · 4 of 32 slices shown]
[im 1/32]
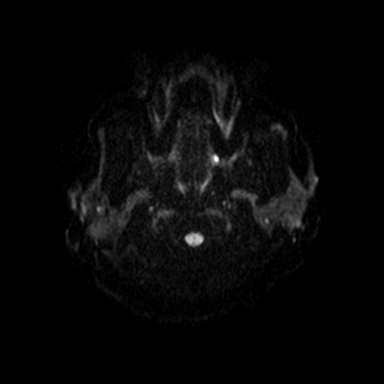
[im 11/32]
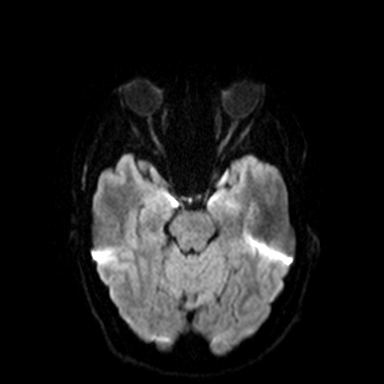
[im 21/32]
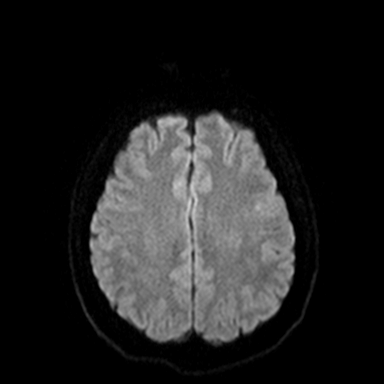
[im 32/32]
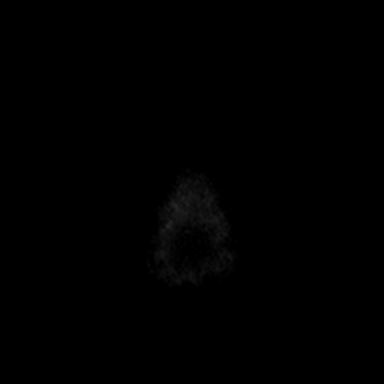

[Series 7: ep2d_diff_(id)_trace_p2_adc · axial · 4.0mm · 0.60mm/px · z∈[-69,+84]mm · 4 of 33 slices shown]
[im 1/33]
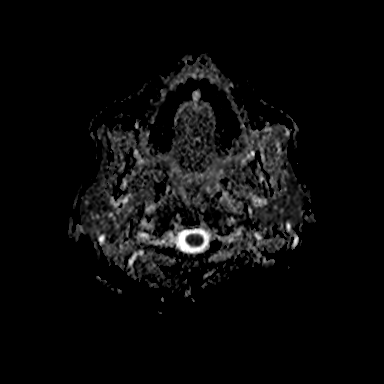
[im 11/33]
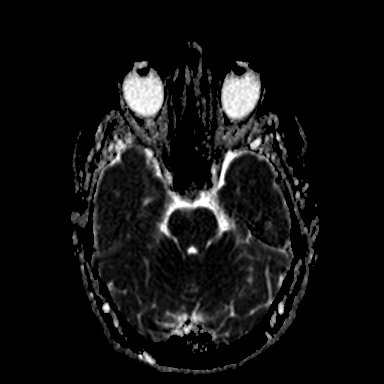
[im 22/33]
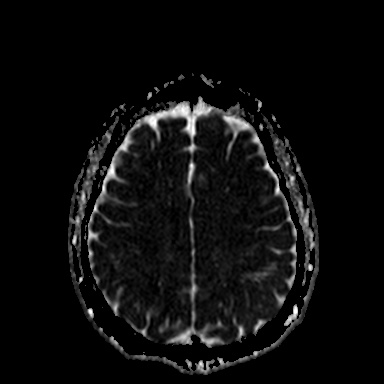
[im 33/33]
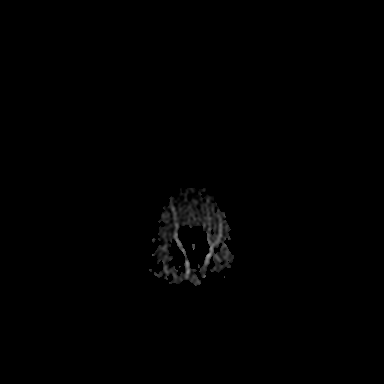

[Series 8: ep2d_diff_(id)_trace_p2_exp · axial · 4.0mm · 0.60mm/px · z∈[-69,+84]mm · 4 of 33 slices shown]
[im 1/33]
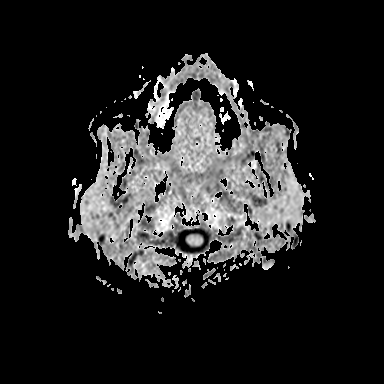
[im 11/33]
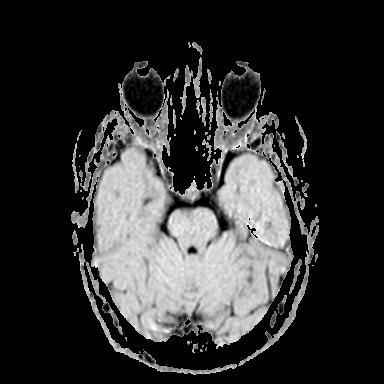
[im 22/33]
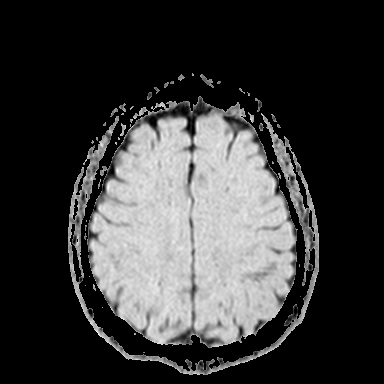
[im 33/33]
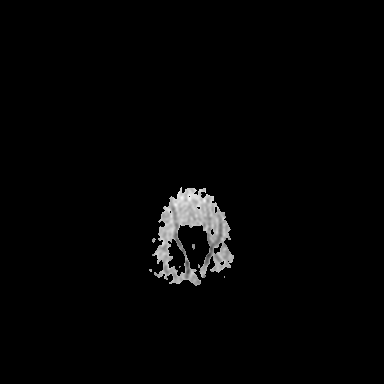

[Series 9: FLAIR · axial · 4.0mm · 0.72mm/px · z∈[-69,+84]mm · 4 of 33 slices shown]
[im 1/33]
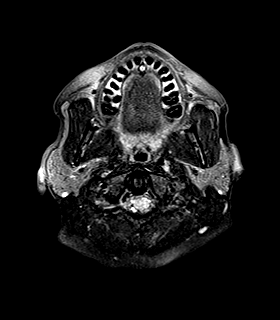
[im 11/33]
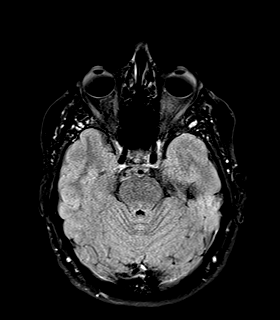
[im 22/33]
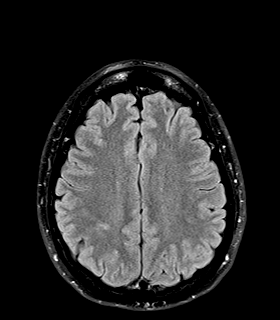
[im 33/33]
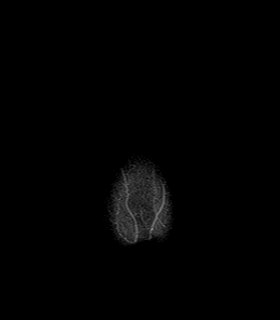

[Series 10: T2 fat-sat · axial · 4.0mm · 0.57mm/px · z∈[-69,+84]mm · 4 of 33 slices shown]
[im 1/33]
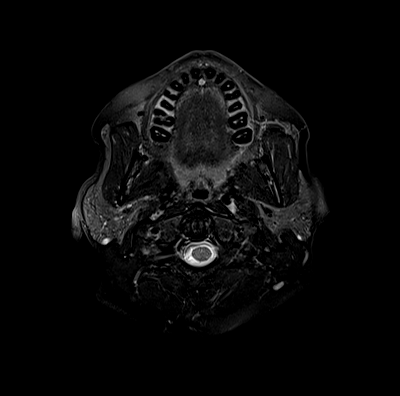
[im 11/33]
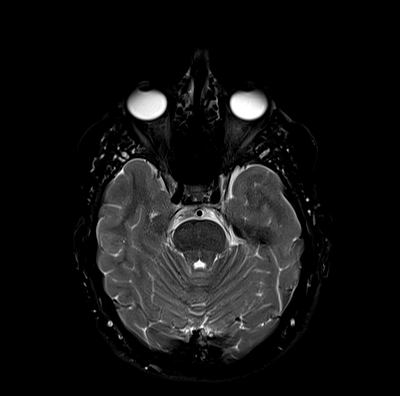
[im 22/33]
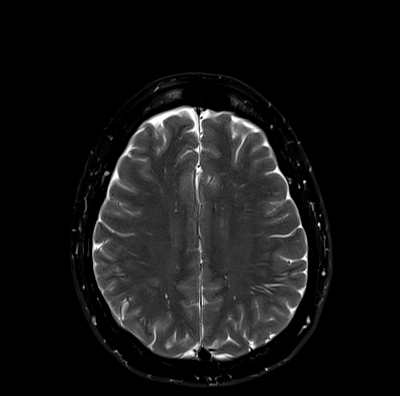
[im 33/33]
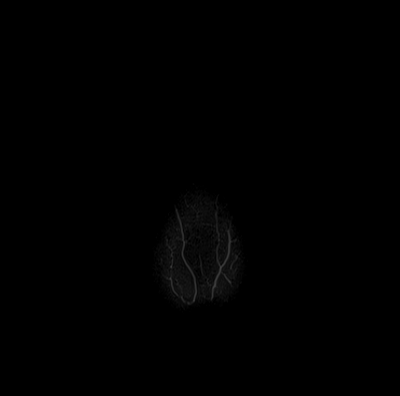

[Series 11: T1 · axial · 4.0mm · 0.72mm/px · z∈[-69,+84]mm · 4 of 33 slices shown (2 of 2)]
[im 1/33]
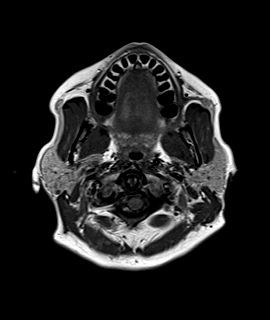
[im 11/33]
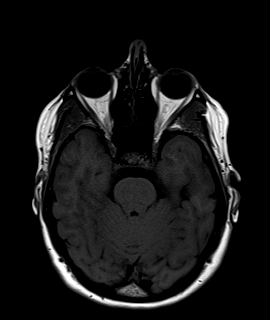
[im 22/33]
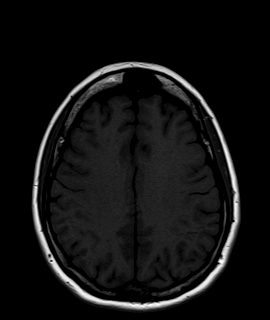
[im 33/33]
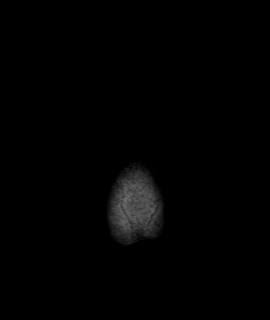

[Series 12: GRE · axial · 4.0mm · 0.45mm/px · z∈[-69,+84]mm · 4 of 33 slices shown]
[im 1/33]
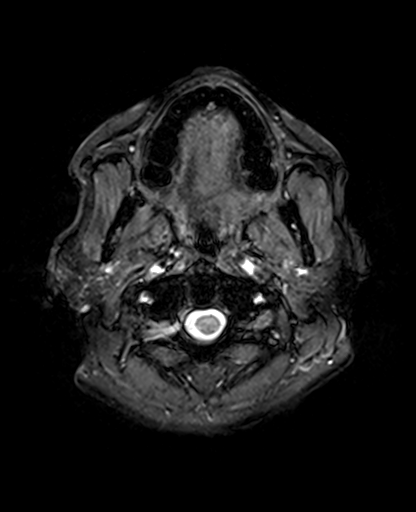
[im 11/33]
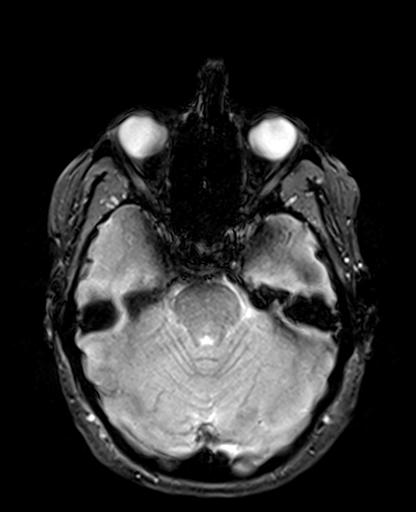
[im 22/33]
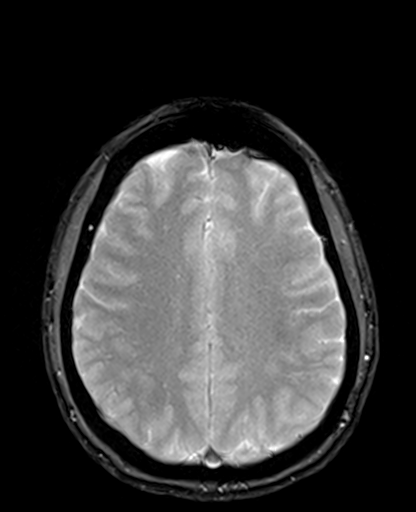
[im 33/33]
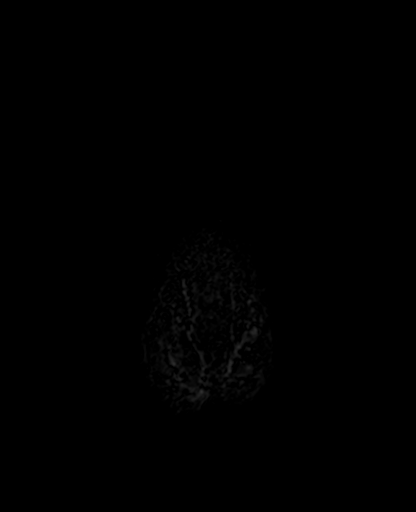

[48 of 48 positions shown; findings below may reference images not displayed]

FINDINGS: No restricted diffusion or associated ADC mapping defect to suggest acute or early subacute infarction. No intracranial hemorrhage. Few rare periventricular and subcortical foci of T2 prolongation likely just normal for patient this age. No cerebral or cerebellar mass or mass effect, midline shift, or extra-axial fluid collection. No hydrocephalus. The larger intracranial flow voids are visualized. The basilar cisterns and aqueducts are maintained. The midline and sellar structures are preserved. Bone marrow signal intensity is maintained in the skull base and calvarium.
The included portions of the orbits, paranasal sinuses, and mastoids are clear.
IMPRESSION: Few rare periventricular and subcortical foci of T2 prolongation likely normal for patient this age. Otherwise, unremarkable MRI brain without contrast.
Is the patient pregnant?
No

## 2023-07-12 ENCOUNTER — Other Ambulatory Visit: Payer: Self-pay | Admitting: Internal Medicine

## 2023-07-12 DIAGNOSIS — R0981 Nasal congestion: Secondary | ICD-10-CM

## 2023-07-12 DIAGNOSIS — H6993 Unspecified Eustachian tube disorder, bilateral: Secondary | ICD-10-CM

## 2023-07-12 DIAGNOSIS — J310 Chronic rhinitis: Secondary | ICD-10-CM

## 2023-08-09 ENCOUNTER — Ambulatory Visit: Payer: No Typology Code available for payment source | Admitting: FAMILY PRACTICE

## 2023-08-09 ENCOUNTER — Other Ambulatory Visit: Payer: Self-pay | Admitting: Internal Medicine

## 2023-08-09 DIAGNOSIS — E039 Hypothyroidism, unspecified: Secondary | ICD-10-CM

## 2023-08-12 NOTE — Telephone Encounter (Signed)
 Pharmacy Refill Optimization (PRO)    Per PRO Protocol, approval or denial will be determined by the provider     Bypassing Epic Protocol Determination (See Protocol Details for specific information)     Meets PRO P&T Approved Protocol: NO - Labs out of range - TSH     Pending to endocrinologist for approval/denial. Last TSH lab 01/19/2023 with repeat pending. Patient has not drawn repeat labs.  ====================================================================    Medication name: LEVOTHYROXINE  Labs (if required by protocol):   Lab Results   Component Value Date    TSH 5.38 (H) 01/19/2023    TSH 7.81 (H) 10/20/2022    FT4 1.49 01/19/2023    FT4 1.49 10/20/2022

## 2023-08-14 ENCOUNTER — Other Ambulatory Visit: Payer: Self-pay | Admitting: Internal Medicine

## 2023-08-14 DIAGNOSIS — E039 Hypothyroidism, unspecified: Secondary | ICD-10-CM

## 2023-08-14 NOTE — Telephone Encounter (Signed)
 MyChart message sent to patient.

## 2023-08-16 ENCOUNTER — Other Ambulatory Visit: Payer: Self-pay

## 2023-08-16 NOTE — Telephone Encounter (Signed)
 Specialty Pharmacy Prior Authorization Status Update    Re: Levothyroxine Sodium tablets      Please see: Specialty Pharmacy on 08/16/2023 for all PA updates.    Thank you,  Nicoletta Ba, CPhT  Pharmacy Technician III  Specialty Pharmacy   Phone [phone], Option 9, Option 9  08/16/2023-3:35 PM

## 2023-08-16 NOTE — Progress Notes (Signed)
 A Prior Authorization Request has been received for Levothyroxine Sodium tablets.     For additional prior authorization status, please refer to the "Referral/Authorizations" tab in the patient's chart. There, requests that are in process or completed will be viewable under the Referral Type "Medication Prior Authorization".    Nicoletta Ba, CPhT  Pharmacy Technician III  Specialty Pharmacy   Phone [phone], Option 9, Option 9  08/16/2023-3:33 PM

## 2023-08-17 ENCOUNTER — Other Ambulatory Visit: Payer: Self-pay

## 2023-08-17 NOTE — Progress Notes (Signed)
 Specialty Pharmacy Prior Authorization Status Update    Prior authorization has been approved.           Sent pt a mychart msg to notify. I also called [facility]# [phone] and spoke with Pharmacy Technician, Clifton Custard. The pt's copay is $3.38 for #96tabs/90ds, they are filling for pt now

## 2023-09-22 ENCOUNTER — Ambulatory Visit: Payer: Self-pay | Admitting: FAMILY PRACTICE

## 2023-09-22 ENCOUNTER — Ambulatory Visit: Admitting: FAMILY PRACTICE

## 2023-09-22 ENCOUNTER — Ambulatory Visit: Attending: Internal Medicine

## 2023-09-22 VITALS — BP 116/66 | HR 105 | Temp 97.8°F | Resp 16 | Ht 66.0 in | Wt 225.1 lb

## 2023-09-22 DIAGNOSIS — E66812 Obesity, class 2: Secondary | ICD-10-CM

## 2023-09-22 DIAGNOSIS — E6609 Other obesity due to excess calories: Secondary | ICD-10-CM

## 2023-09-22 DIAGNOSIS — Z1231 Encounter for screening mammogram for malignant neoplasm of breast: Secondary | ICD-10-CM

## 2023-09-22 DIAGNOSIS — Z13228 Encounter for screening for other metabolic disorders: Secondary | ICD-10-CM | POA: Insufficient documentation

## 2023-09-22 DIAGNOSIS — J4540 Moderate persistent asthma, uncomplicated: Secondary | ICD-10-CM

## 2023-09-22 DIAGNOSIS — Z6836 Body mass index (BMI) 36.0-36.9, adult: Secondary | ICD-10-CM

## 2023-09-22 DIAGNOSIS — Z1211 Encounter for screening for malignant neoplasm of colon: Secondary | ICD-10-CM

## 2023-09-22 DIAGNOSIS — G4700 Insomnia, unspecified: Secondary | ICD-10-CM

## 2023-09-22 DIAGNOSIS — E039 Hypothyroidism, unspecified: Secondary | ICD-10-CM

## 2023-09-22 DIAGNOSIS — E559 Vitamin D deficiency, unspecified: Secondary | ICD-10-CM | POA: Insufficient documentation

## 2023-09-22 LAB — LIPID PANEL WITH DLDL REFLEX
Cholesterol: 158 mg/dL (ref <200)
HDL Cholesterol: 61 mg/dL (ref >40)
LDL Cholesterol Calculation: 89 mg/dL (ref <100)
Non-HDL Cholesterol: 97 mg/dL (ref <=150)
Total Cholesterol: HDL Ratio: 2.6 (ref <4.0)
Triglyceride Level: 41 mg/dL (ref <150)

## 2023-09-22 LAB — HEMOGLOBIN A1C
Hgb A1C,Glucose Est Avg: 108 mg/dL
Hgb A1C: 5.4 % (ref 3.9–5.6)

## 2023-09-22 LAB — THYROID STIMULATING HORMONE: Thyroid Stimulating Hormone: 0.17 u[IU]/mL — ABNORMAL LOW (ref 0.27–4.20)

## 2023-09-22 LAB — VITAMIN D, 25 HYDROXY: Vitamin D, 25 Hydroxy: 17.4 ng/mL (ref 10.0–50.0)

## 2023-09-22 LAB — THYROXINE, FREE (FREE T4): Thyroxine, Free (Free T4): 1.81 ng/dL — ABNORMAL HIGH (ref 0.92–1.68)

## 2023-09-22 LAB — THYROID PEROXIDASE (TPO) AB: THYROID PEROXIDASE (TPO) Ab: 631 [IU]/mL — ABNORMAL HIGH (ref <35)

## 2023-09-22 MED ORDER — LEVOTHYROXINE 175 MCG TABLET: 175.0000 ug | ORAL_TABLET | Freq: Every day | ORAL | 0 refills | Status: DC

## 2023-09-22 NOTE — Nursing Note (Signed)
Danielle Barber has been verified by full name and DOB, vital obtained/documented, allergies verified, pharmacy confirmed.      Joanie Coddington, MA

## 2023-09-22 NOTE — Progress Notes (Signed)
 S: 48yo F with depression, moderate persistent asthma , insomnia, inattention, hypothyroidpresent today for follow up on general health    #Dizzy spell   -usually when standing up from sitting  -1 weeks   -drink carbonated water about 6 cans per day  -pulses usually around 80s    #Asthma  -Stable    #Obesity  -eating more whole food and decrease portion     #Hypothyroid  pending        Current Outpatient Medications on File Prior to Visit:     Albuterol  (PROAIR  HFA, PROVENTIL  HFA, VENTOLIN  HFA) 90 mcg/actuation inhaler    Azelastine  Nasal (ASTELIN ) 137 mcg (0.1 %) Spray    BuPROPion  (WELLBUTRIN  XL) 300 mg XL tablet    Doxycycline  100 mg Capsule    Escitalopram  (LEXAPRO ) 20 mg tablet    Fluocinonide  (LIDEX ) 0.05 % Ointment    Fluticasone  (FLONASE ) 50 mcg/actuation nasal spray    Inhalational Spacing Device (AEROCHAMBER PLUS-FLOW SIGNAL) Spacer    LevoTHYROxine  175 mcg Tablet    Metronidazole  (METROCREAM ) 0.75 % cream    Montelukast  (SINGULAIR ) 10 mg Tablet    O:  Temp: 36.6 C (97.8 F) (05/09 1011)  Temp src: --  Pulse: 105 (05/09 1011)  BP: 116/66 (05/09 1011)  Resp: 16 (05/09 1011)  SpO2: 98 % (05/09 1011)  Height: 167.6 cm (5\' 6" ) (05/09 1011)  Weight: 102.1 kg (225 lb 1.4 oz) (05/09 1011)  Gen: In no acute distress, pleasant  HEENT: NCAT,EOMI,    A/P:  Danielle Barber was seen today for follow-up.  Diagnoses and all orders for this visit:  Acquired hypothyroidism  Pending TSH result for levothyroxine  dose adjustment  Class 2 obesity due to excess calories without serious comorbidity with body mass index (BMI) of 36.0 to 36.9 in adult  Doing well with portion reduction. Remind patient to ensure balance nutritional intake. Increase physical activity/weight training. Reassess next visit.  Moderate persistent asthma without complication  Stable continue current regimen  Overview:  -takes montelukast  only uses albuterol  x2 per month  Insomnia, unspecified type  Patient prefer to revisit concern after obesity has  improved.  Overview:  -years ago tried Lunesta and Ambien but intolerant to side effect   Visit for screening mammogram  -     BREAST MAMMOGRAM SCREENING W/TOMO; Future  Colon cancer screening  Remind to complete.  -     Fecal by Immunoassay (FIT); Future    Next visit:  -Follow up weight management  -Follow up hypothyroid    Cheng-lun Jerrilyn Moras)  Donna Fus, MD  [facility]   PI# [phone]

## 2023-09-29 ENCOUNTER — Ambulatory Visit
Admission: RE | Admit: 2023-09-29 | Discharge: 2023-09-29 | Disposition: A | Discharge status: Home / Self Care | Source: Ambulatory Visit | Attending: Radiologic Technologist | Admitting: Radiologic Technologist

## 2023-09-29 DIAGNOSIS — Z1231 Encounter for screening mammogram for malignant neoplasm of breast: Secondary | ICD-10-CM | POA: Insufficient documentation

## 2023-10-03 NOTE — Progress Notes (Signed)
 Hello! Danielle Barber,    I have reviewed and released the results.  Noticed that Dr.Wang has made recommendations to change levothyroxine  dose.    Please continue follow up with Dr. Donna Fus. And return to endocrine clinic as needed.     Best regards,   Marius Siemens, MD

## 2023-10-11 ENCOUNTER — Ambulatory Visit: Payer: Self-pay | Admitting: Family Medicine

## 2023-10-17 ENCOUNTER — Encounter: Payer: Self-pay | Admitting: Internal Medicine

## 2023-10-17 ENCOUNTER — Encounter: Payer: Self-pay | Admitting: FAMILY PRACTICE

## 2023-10-17 DIAGNOSIS — J310 Chronic rhinitis: Secondary | ICD-10-CM

## 2023-10-17 DIAGNOSIS — H6993 Unspecified Eustachian tube disorder, bilateral: Secondary | ICD-10-CM

## 2023-10-17 DIAGNOSIS — R0981 Nasal congestion: Secondary | ICD-10-CM

## 2023-10-17 DIAGNOSIS — J4540 Moderate persistent asthma, uncomplicated: Secondary | ICD-10-CM

## 2023-10-18 MED ORDER — ALBUTEROL SULFATE HFA 90 MCG/ACTUATION AEROSOL INHALER: 1.0000 | INHALATION_SPRAY | RESPIRATORY_TRACT | 2 refills | Status: AC | PRN

## 2023-10-18 MED ORDER — FLUTICASONE PROPIONATE 50 MCG/ACTUATION NASAL SPRAY,SUSPENSION: 2.0000 | Freq: Every day | NASAL | 2 refills | Status: AC

## 2023-10-18 NOTE — Telephone Encounter (Signed)
 Pharmacy Refill Optimization (PRO)    Refill authorized per PRO CPA 690-00 10/18/2023    Meets PRO CPA 690-00: YES    See Protocol Details for additional information   ====================================================================    Medication name:   Requested Prescriptions     Pending Prescriptions Disp Refills    Albuterol  (PROAIR  HFA, PROVENTIL  HFA, VENTOLIN  HFA) 90 mcg/actuation inhaler 8.5 g 2     Sig: Take 1-2 puffs by inhalation every 4 hours if needed for wheezing.     Labs (if required by protocol): N/A

## 2023-10-18 NOTE — Telephone Encounter (Signed)
 Pharmacy Refill Optimization (PRO)    Refill authorized per PRO CPA 690-00 10/18/2023    Meets PRO CPA 690-00: YES    See Protocol Details for additional information   ====================================================================    Medication name:   Requested Prescriptions     Pending Prescriptions Disp Refills    Fluticasone  (FLONASE ) 50 mcg/actuation nasal spray 48 mL 2     Sig: Instill 2 sprays into EACH nostril every day. (Nasal sinus congestion)     Labs (if required by protocol): N/A

## 2023-10-23 ENCOUNTER — Telehealth: Payer: Self-pay | Admitting: FAMILY PRACTICE

## 2023-10-23 NOTE — Telephone Encounter (Addendum)
 Spoke to patient x2 identifiers relayed MD message, patient aware.

## 2023-12-08 ENCOUNTER — Ambulatory Visit: Attending: FAMILY PRACTICE

## 2023-12-08 ENCOUNTER — Ambulatory Visit: Payer: Self-pay | Admitting: FAMILY PRACTICE

## 2023-12-08 DIAGNOSIS — E039 Hypothyroidism, unspecified: Secondary | ICD-10-CM | POA: Insufficient documentation

## 2023-12-08 LAB — THYROXINE, FREE (FREE T4): Thyroxine, Free (Free T4): 1.32 ng/dL (ref 0.92–1.68)

## 2023-12-08 LAB — THYROID STIMULATING HORMONE: Thyroid Stimulating Hormone: 0.23 u[IU]/mL — ABNORMAL LOW (ref 0.27–4.20)

## 2023-12-08 MED ORDER — LEVOTHYROXINE 175 MCG TABLET: 175.0000 ug | ORAL_TABLET | Freq: Every day | ORAL | 0 refills | Status: DC

## 2023-12-08 MED ORDER — LEVOTHYROXINE 150 MCG TABLET: 150.0000 ug | ORAL_TABLET | Freq: Every day | ORAL | 0 refills | Status: DC

## 2023-12-22 ENCOUNTER — Ambulatory Visit: Admitting: FAMILY PRACTICE

## 2024-01-18 ENCOUNTER — Ambulatory Visit: Admitting: FAMILY PRACTICE

## 2024-01-24 ENCOUNTER — Ambulatory Visit: Attending: FAMILY PRACTICE

## 2024-01-24 DIAGNOSIS — E039 Hypothyroidism, unspecified: Secondary | ICD-10-CM | POA: Insufficient documentation

## 2024-01-24 LAB — THYROXINE, FREE (FREE T4): Thyroxine, Free (Free T4): 1.18 ng/dL (ref 0.92–1.68)

## 2024-01-24 LAB — THYROID STIMULATING HORMONE: Thyroid Stimulating Hormone: 1.06 u[IU]/mL (ref 0.27–4.20)

## 2024-01-25 ENCOUNTER — Ambulatory Visit: Payer: Self-pay | Admitting: FAMILY PRACTICE

## 2024-01-25 ENCOUNTER — Ambulatory Visit: Admitting: FAMILY PRACTICE

## 2024-02-01 ENCOUNTER — Ambulatory Visit: Admitting: FAMILY PRACTICE

## 2024-02-01 ENCOUNTER — Ambulatory Visit: Attending: FAMILY PRACTICE

## 2024-02-01 ENCOUNTER — Ambulatory Visit: Payer: Self-pay | Admitting: FAMILY PRACTICE

## 2024-02-01 VITALS — BP 132/73 | HR 94 | Temp 97.8°F | Resp 16 | Ht 66.0 in | Wt 206.8 lb

## 2024-02-01 DIAGNOSIS — E039 Hypothyroidism, unspecified: Secondary | ICD-10-CM

## 2024-02-01 DIAGNOSIS — R233 Spontaneous ecchymoses: Secondary | ICD-10-CM | POA: Insufficient documentation

## 2024-02-01 DIAGNOSIS — Z23 Encounter for immunization: Secondary | ICD-10-CM

## 2024-02-01 DIAGNOSIS — L409 Psoriasis, unspecified: Secondary | ICD-10-CM

## 2024-02-01 LAB — INR
INR: 0.8 — ABNORMAL LOW (ref 0.9–1.1)
Prothrombin Time: 9.4 s — ABNORMAL LOW (ref 10.0–12.8)

## 2024-02-01 LAB — PLATELET FUNCTION SCREEN
Collagen/ADP: 63 s — ABNORMAL LOW (ref 71–118)
Collagen/Epinephrine: 87 s — ABNORMAL LOW (ref 94–193)

## 2024-02-01 LAB — CBC WITH DIFFERENTIAL
Basophils % Auto: 0.8 %
Basophils Abs Auto: 0.1 K/MM3 (ref 0.0–0.2)
Eosinophils % Auto: 3.5 %
Eosinophils Abs Auto: 0.4 K/MM3 (ref 0.0–0.5)
Hematocrit: 40.5 % (ref 34.0–46.0)
Hemoglobin: 13.4 g/dL (ref 12.0–16.0)
Lymphocytes % Auto: 12.2 %
Lymphocytes Abs Auto: 1.2 K/MM3 (ref 1.0–4.8)
MCH: 29.8 pg (ref 27.0–33.0)
MCHC: 33.2 % (ref 32.0–36.0)
MCV: 89.7 fL (ref 80.0–100.0)
MPV: 7.7 fL (ref 6.8–10.0)
Monocytes % Auto: 6.2 %
Monocytes Abs Auto: 0.6 K/MM3 (ref 0.1–0.8)
Neutrophils % Auto: 77.3 %
Neutrophils Abs Auto: 7.8 K/MM3 — ABNORMAL HIGH (ref 1.8–7.7)
Platelet Count: 393 K/MM3 (ref 130–400)
RDW: 14.5 % (ref 0.0–14.7)
Red Blood Cell Count: 4.51 M/MM3 (ref 3.70–5.50)
White Blood Cell Count: 10.1 K/MM3 (ref 4.5–11.0)

## 2024-02-01 LAB — THYROID STIMULATING HORMONE: Thyroid Stimulating Hormone: 5.71 u[IU]/mL — ABNORMAL HIGH (ref 0.27–4.20)

## 2024-02-01 LAB — APTT STUDIES: aPTT: 29.6 s (ref 26.0–37.2)

## 2024-02-01 LAB — THYROXINE, FREE (FREE T4): Thyroxine, Free (Free T4): 1.17 ng/dL (ref 0.92–1.68)

## 2024-02-01 MED ORDER — LEVOTHYROXINE 150 MCG TABLET: 150.0000 ug | ORAL_TABLET | Freq: Every day | ORAL | 3 refills | Status: AC

## 2024-02-01 MED ORDER — LEVOTHYROXINE 175 MCG TABLET: 175.0000 ug | ORAL_TABLET | Freq: Every day | ORAL | 1 refills | Status: DC

## 2024-02-01 NOTE — Progress Notes (Signed)
 S: 48yo F with dermatitis, hypothyroid present today for follow up on hypothyroid, weight management, skin lesion, and easy bruising    #Obesity  -continues to make good progress with portion reduction. Felt she has slowed down but knows what needs to be done.    #Hypothyroid  -most recent TSH at goal. Need refill on the    #Easy bruising  -felt easily bruise and often don't know where it comes from   -mother has essential thromcythemia      Current Outpatient Medications on File Prior to Visit:     Albuterol  (PROAIR  HFA, PROVENTIL  HFA, VENTOLIN  HFA) 90 mcg/actuation inhaler    Azelastine  Nasal (ASTELIN ) 137 mcg (0.1 %) Spray    BuPROPion  (WELLBUTRIN  XL) 300 mg XL tablet    Doxycycline  100 mg Capsule    Escitalopram  (LEXAPRO ) 20 mg tablet    Fluocinonide  (LIDEX ) 0.05 % Ointment    Fluticasone  (FLONASE ) 50 mcg/actuation nasal spray    Metronidazole  (METROCREAM ) 0.75 % cream    Montelukast  (SINGULAIR ) 10 mg Tablet    O:  Temp: 36.6 C (97.8 F) (09/18 0901)  Temp src: --  Pulse: 94 (09/18 0901)  BP: 132/73 (09/18 0901)  Resp: 16 (09/18 0901)  SpO2: 97 % (09/18 0901)  Height: 167.6 cm (5' 6) (09/18 0901)  Weight: 93.8 kg (206 lb 12.7 oz) (09/18 0901)  Gen: In no acute distress, pleasant  HEENT: NCAT,EOMI,  Skin:         Labs reveiwed:  Lab Results   Component Value Date    TSH 1.06 01/24/2024     Lab Results   Component Value Date    PLT 353 10/20/2022       A/P:  Danielle Barber was seen today for follow-up.  Diagnoses and all orders for this visit:  Psoriasis  Chronic not at goal. Refractory to topical steroid. Will refer back to dermatology.  -     Dermatology Clinic Referral  Hypothyroidism, unspecified type  Chronic at goal. Continue current regimen.  -     levoTHYROxine  150 mcg tablet; Take 1 tablet by mouth every morning before a meal. Sunday take on an empty stomach  Dispense: 12 tablet; Refill: 3  -     levoTHYROxine  175 mcg tablet; Take 1 tablet by mouth every morning before a meal. Monday to Saturday   Dispense: 72 tablet; Refill: 1  -     Thyroid  Stimulating Hormone; Future  -     Thyroxine, Free (Free T4); Future  Easy bruising  Chronic not at goal. Will assess for abnormal clotting pattern.   -     aPTT Studies; Future  -     INR; Future  -     CBC with Differential; Future  -     Platelet Function Screen; Future        3 of chronic condition not at goal with prescription management   4+ tests ordered/ reviewed    Total time I spent in care of this patient today (excluding time spent on other billable services) was 25 minutes.     Next visit:  -Follow up thyroid  lab, weight management, bruising     Cheng-lun Blaise)  Cesario, MD  [facility]   PI# [phone]

## 2024-02-01 NOTE — Nursing Note (Signed)
Tiffiany Fuoco has been verified by full name and DOB, vital obtained/documented, allergies verified, pharmacy confirmed.      Joanie Coddington, MA

## 2024-02-01 NOTE — Nursing Note (Signed)
 Per orders of Dr. Regino Schultze, injection of Prevnar 20 given by Joanie Coddington, MA. Patient instructed to remain in clinic for 20 minutes afterwards, and to report any adverse reaction to me immediately.

## 2024-02-06 ENCOUNTER — Other Ambulatory Visit: Payer: Self-pay | Admitting: FAMILY PRACTICE

## 2024-02-06 DIAGNOSIS — F32A Depression, unspecified: Secondary | ICD-10-CM

## 2024-02-07 NOTE — Telephone Encounter (Signed)
 Pharmacy Refill Optimization (PRO)    Refill authorized per PRO CPA 690-00 02/07/2024    Meets PRO CPA 690-00: YES    See Protocol Details for additional information   ====================================================================    Medication name:   Requested Prescriptions     Pending Prescriptions Disp Refills    buPROPion  (Wellbutrin  XL) 300 mg XL tablet [Pharmacy Med Name: BUPROPION  HCL XL 300 MG TABLET] 90 tablet 3     Sig: TAKE 1 TABLET BY MOUTH EVERY DAY IN THE MORNING    escitalopram  (Lexapro ) 20 mg tablet [Pharmacy Med Name: ESCITALOPRAM  20 MG TABLET] 90 tablet 3     Sig: TAKE 1 TABLET BY MOUTH EVERY DAY IN THE MORNING     Labs (if required by protocol): N/A  09/22/23 med marked as taking

## 2024-02-20 NOTE — Progress Notes (Unsigned)
 Visit Date: 02/22/2024   Last Seen: 02/16/2023 by Dr. Candyce    Follow-up visit for Danielle Barber (DOB: 10/12/1975), a 48yr old female.    Chief complaint: Rash on ankle.***   History obtained from: Patient. 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 But when she stops, rash will return); Notes the rash on the ankle is near a scar;   Interval 02/22/2024: ***   PE: 5 cm oval-shaped erythematous, scaly plaque present on the left lateral ankle. 1x nail pitting on the left fifth digit. ***   Assessment: Psoriasis, chronic, flaring ***   Plan: ***   - Discussed diagnosis and treatment options. Patient is in agreement to plan.  - Informed patient there is no cure for this condition.  -Start Lidex  0.05% Ointment QHS on affected areas. Apply and wrap with saran wrap.   - Moisturizer in day time.  - RTC in 3 months.     Dx: Papulopustular Rosacea   HPI: Pt reports redness of the face; Denies allergies to any medication; Exercise exacerbates rosacea; Notes her skin is dry.  Interval 02/22/2024: ***   PE: Inflammation and papules on the nose, cheeks, forehead, and around the mouth. With underlying telangiectasia. ***    Assessment: Papulopustular Rosacea, chronic, flaring ***   Plan:  ***   - Discussed diagnosis and treatment options. Patient is in agreement to plan.  - Start Doxycycline  100 mg BID for 2 months then decrease to 50 mg BID for 1 month with meal but avoid dairy 1 hour before or after taking.   - Start Metronidazole  0.75% Cream BID on the face.   - RTC in 3 months.     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   Benadryl works best   [employer] zyrtec/claritin even claritin D w/o benefit   Nasal steroid, cause sneezing      Depressive disorder 09/17/2004     -has been on escitalopram  and bupropion  for many years. Symptom is stable   -seeing therapist       Insomnia 09/17/2004     -years ago tried Lunesta and Ambien but intolerant to side effect         Meds:  List in EMR viewed today    Allergies:    List in EMR viewed today    Labs Reviewed:  -    Areas Examined:   DERM EXAM [id number]; ***    ***

## 2024-02-22 ENCOUNTER — Ambulatory Visit
Attending: Student in an Organized Health Care Education/Training Program | Admitting: Student in an Organized Health Care Education/Training Program

## 2024-02-22 DIAGNOSIS — L719 Rosacea, unspecified: Secondary | ICD-10-CM | POA: Insufficient documentation

## 2024-02-22 DIAGNOSIS — L0100 Impetigo, unspecified: Secondary | ICD-10-CM | POA: Insufficient documentation

## 2024-02-22 DIAGNOSIS — R21 Rash and other nonspecific skin eruption: Secondary | ICD-10-CM | POA: Insufficient documentation

## 2024-02-22 DIAGNOSIS — L718 Other rosacea: Secondary | ICD-10-CM | POA: Insufficient documentation

## 2024-02-22 DIAGNOSIS — L308 Other specified dermatitis: Secondary | ICD-10-CM | POA: Insufficient documentation

## 2024-02-22 DIAGNOSIS — L258 Unspecified contact dermatitis due to other agents: Secondary | ICD-10-CM | POA: Insufficient documentation

## 2024-02-22 MED ORDER — DOXYCYCLINE MONOHYDRATE 40 MG CAPSULE,IMMEDIATE - DELAY RELEASE: DELAYED_RELEASE_CAPSULE | ORAL | 3 refills | Status: AC

## 2024-02-22 NOTE — Patient Instructions (Addendum)
 If a medication is ordered for you at your visit today, please be aware that your insurance plan may require a prior authorization to be completed prior to approving the medication to be dispensed. Prior authorizations may take 24-72 business hours to be processed. We recommend calling your pharmacy to determine if a prior authorization is needed prior to going to the pharmacy.  Please call the office with any questions.    - Start Oracea  40 mg. Take 1 capsule by mouth daily.      Skin Biopsy    How do I take care of the biopsy site?    For a sutured site: leave the original dressing in place for 24 hours. After 24 hours, you may clean the area twice daily with mild soap or cleanser and water and pat dry. Apply a thin layer of Vaseline over the sutures and cover with a band - aid or a non - stick Telfa bandage as instructed. If the bandage becomes wet or soiled, remove it and clean the area as described above before applying a new bandage.    For a non - sutured site: leave the original dressing in place till the following day. If a small a small amount of drainage is encountered, follow the above procedure (i.e. sutured site care) until the area is smooth to the touch (usually less than one week.    If bleeding occurs:    Apply steady pressure for 15 minutes.  If bleeding persists, call your physician immediately.    Infection    It is common to have slight redness and swelling at the biopsy site. However, you should contact your physician immediately if any of the following signs occur:    Increased redness or swelling, drainage, pain or feeling of warmth at the biopsy sites.  Red streaks leading away from the wound  Fever, chills    How are the sutures removed?    Some sutures are "absorbable" and do not require removal. Most do require removal and the physician or nurse will do so and evaluate the wound healing process at 1 to 2 weeks following the biopsy.    How do I find out about my results?    Biopsy results  can be discussed over the telephone or during a follow up visit. Please, before you leave the office, make sure you understand the arrangements that have been made to give you your results.    Biopsy results typically require 5 to 7 days after the biopsy to reach your dermatologist. If you haven't received your results after approximately two weeks, please call [phone], and press option 4.

## 2024-02-22 NOTE — Nursing Note (Signed)
 Identified patient by name and DOB, screened for pain, mobility assessment, reviewed allergies and verified pharmacy. Vitals not taken per provider's request.      Patient WAS wearing a surgical mask   Droplet precautions were followed when caring for the patient.    PPE used by provider during encounter: Surgical mask     I verified with patient; it is okay to leave a detailed message regarding confidential medical information on [phone].        Delon Hollingsworth, MA

## 2024-02-27 LAB — DERMATOLOGY PATHOLOGY

## 2024-03-04 ENCOUNTER — Ambulatory Visit: Payer: Self-pay | Admitting: Student in an Organized Health Care Education/Training Program

## 2024-03-06 ENCOUNTER — Telehealth: Payer: Self-pay | Admitting: Student in an Organized Health Care Education/Training Program

## 2024-03-06 NOTE — Telephone Encounter (Signed)
 Prior Authorization has been submitted for Doxycycline  40mg .

## 2024-03-07 ENCOUNTER — Other Ambulatory Visit: Payer: Self-pay | Admitting: FAMILY PRACTICE

## 2024-03-07 DIAGNOSIS — E039 Hypothyroidism, unspecified: Secondary | ICD-10-CM

## 2024-03-07 NOTE — Telephone Encounter (Signed)
 Pharmacy Refill Optimization (PRO)    Refill request denied as REFILL TOO SOON per PRO workflow 03/07/2024    Prescription renewal addressed on 02/01/24 #12 + 3R, [facility] to [facility]

## 2024-03-07 NOTE — Telephone Encounter (Signed)
 Pt would like more information regarding ruxalitinib cream. Nolan Tuazon, LVN

## 2024-03-15 ENCOUNTER — Telehealth: Payer: Self-pay | Admitting: Student in an Organized Health Care Education/Training Program

## 2024-03-15 NOTE — Telephone Encounter (Addendum)
 Per health net Prior authorization was denied, patient needs 1 prerequisite drug preferred oral Tetracycline antibiotic  or immediate release minocycline etc, or state why you cannot or you must try the prerequisite for at least 4 weeks or tell us  why you cannot. Please advise

## 2024-03-20 MED ORDER — MINOCYCLINE 50 MG TABLET: ORAL_TABLET | ORAL | 3 refills | Status: AC

## 2024-03-20 NOTE — Addendum Note (Signed)
 Addended by: Mariane Burpee on: 03/20/2024 10:46 AM     Modules accepted: Orders

## 2024-04-30 IMAGING — CR XR FLAT AND UPRIGHT ABDOMEN
3 series · 3 of 3 positions shown · non-contrast
Comparison: None.

FINAL REPORT:
XR FLAT AND UPRIGHT ABDOMEN
INDICATION: Rule out obstruction.
TECHNIQUE: Frontal and upright views of the abdomen were submitted for interpretation.

[AP (1 of 3)]
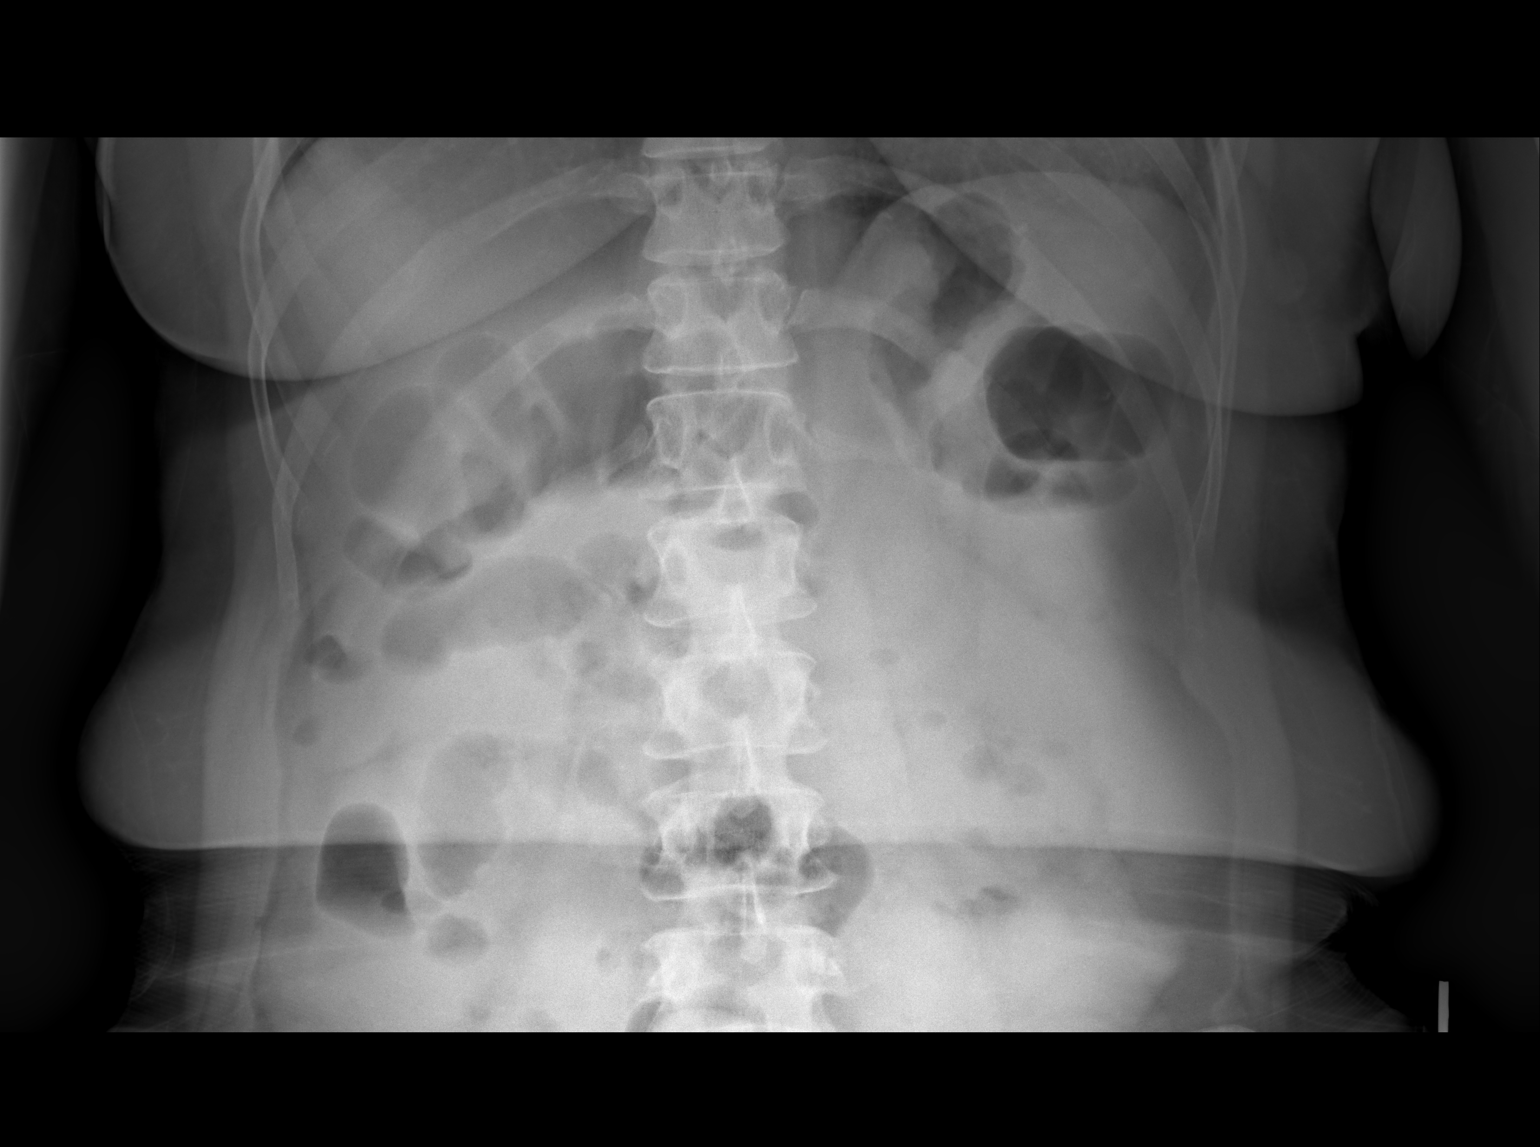

[AP (2 of 3)]
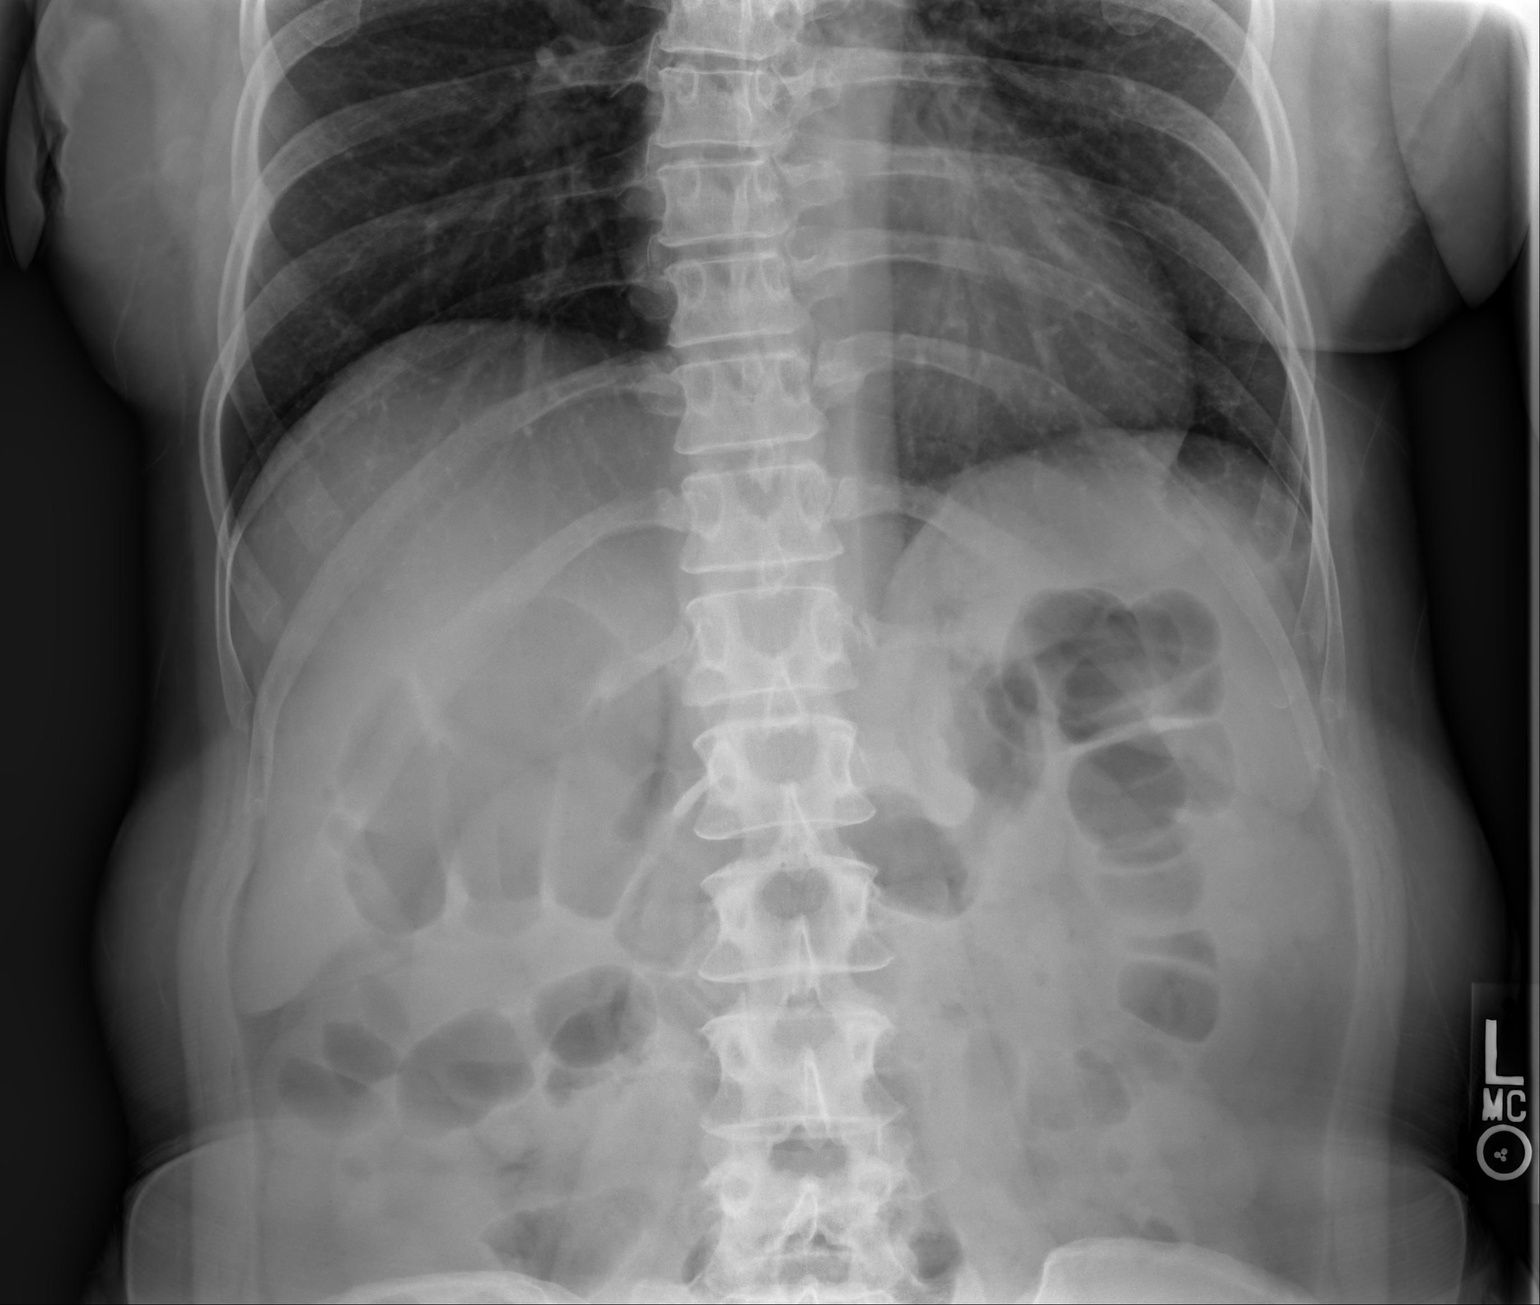

[AP (3 of 3)]
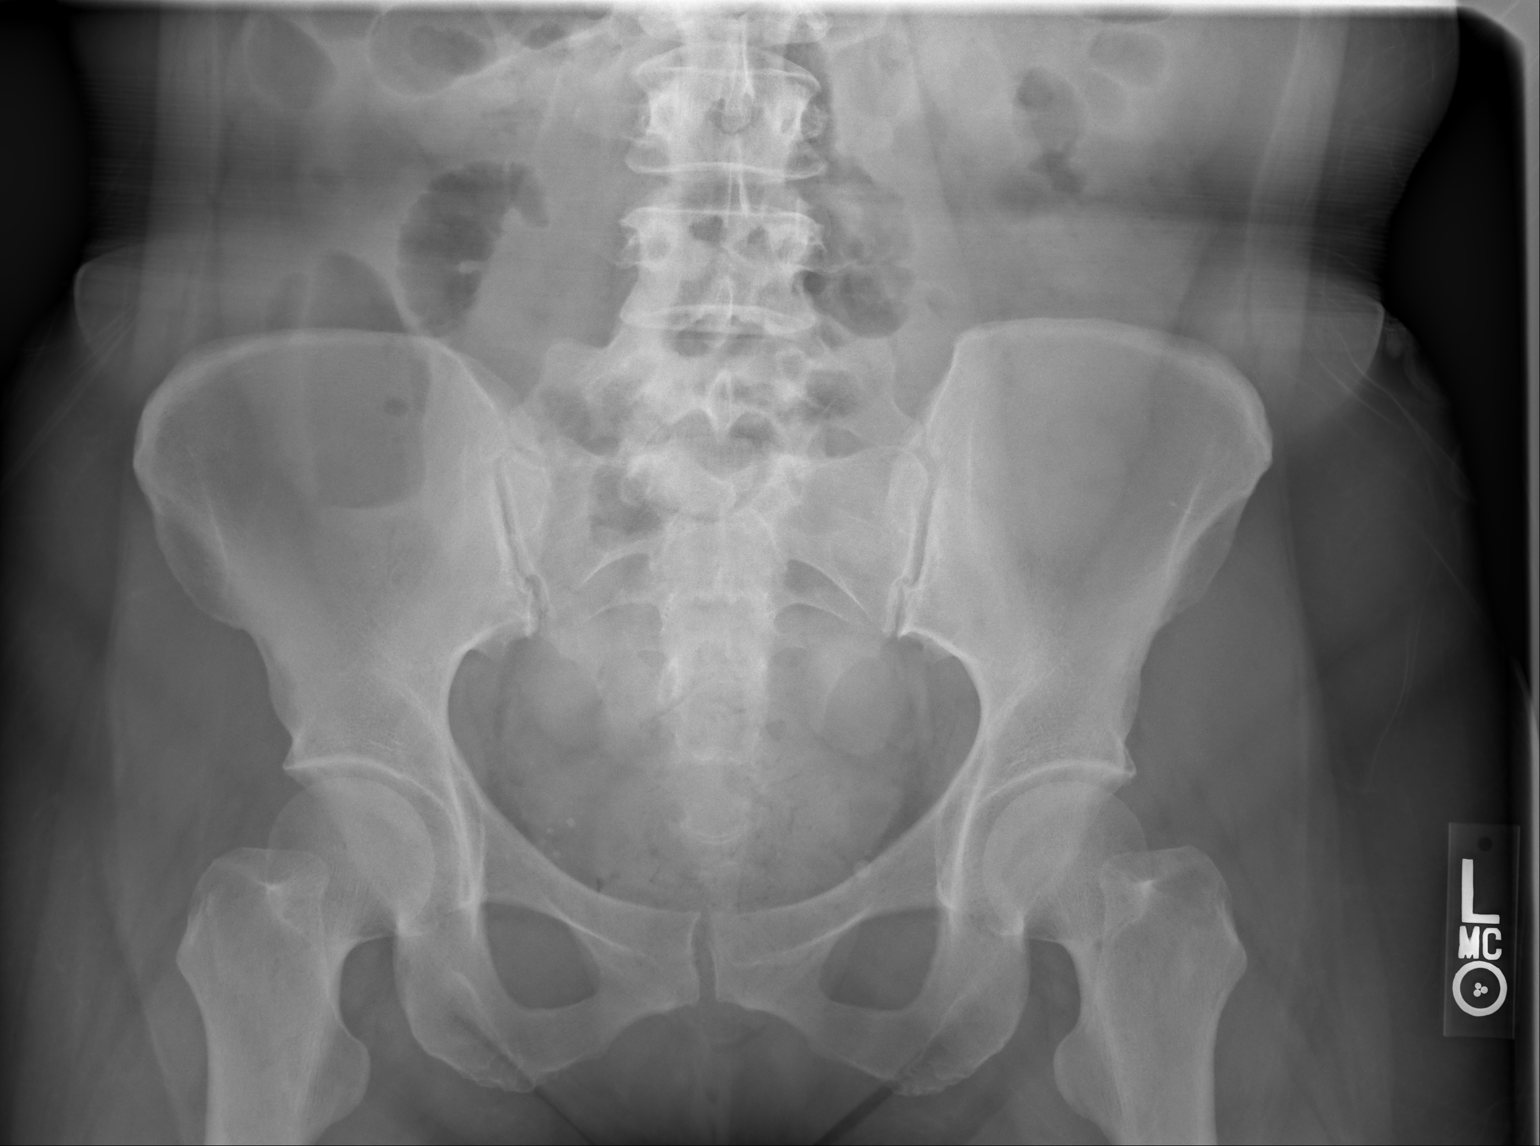

[3 of 3 positions shown; findings below may reference images not displayed]

FINDINGS: Phleboliths overlie the pelvis. A nonspecific but nonobstructive bowel gas pattern is present. No free intraperitoneal air. The osseous structures are intact.
IMPRESSION: Nonspecific but nonobstructive bowel gas pattern.
Portable?
No

## 2024-05-02 ENCOUNTER — Ambulatory Visit: Admitting: FAMILY PRACTICE

## 2024-06-06 ENCOUNTER — Ambulatory Visit: Payer: Self-pay | Admitting: FAMILY PRACTICE

## 2024-06-06 ENCOUNTER — Ambulatory Visit: Attending: FAMILY PRACTICE

## 2024-06-06 DIAGNOSIS — E039 Hypothyroidism, unspecified: Secondary | ICD-10-CM | POA: Insufficient documentation

## 2024-06-06 LAB — TSH WITH FREE T4 REFLEX: Thyroid Stimulating Hormone: 1.18 u[IU]/mL (ref 0.27–4.20)

## 2024-06-07 ENCOUNTER — Encounter: Payer: Self-pay | Admitting: FAMILY PRACTICE

## 2024-06-07 ENCOUNTER — Ambulatory Visit: Payer: Self-pay | Admitting: FAMILY PRACTICE

## 2024-06-07 VITALS — BP 121/70 | HR 97 | Temp 97.6°F | Wt 221.6 lb

## 2024-06-07 DIAGNOSIS — E039 Hypothyroidism, unspecified: Secondary | ICD-10-CM

## 2024-06-07 DIAGNOSIS — Z299 Encounter for prophylactic measures, unspecified: Secondary | ICD-10-CM

## 2024-06-07 DIAGNOSIS — F32A Depression, unspecified: Secondary | ICD-10-CM

## 2024-06-07 DIAGNOSIS — Z79899 Other long term (current) drug therapy: Secondary | ICD-10-CM

## 2024-06-07 DIAGNOSIS — Z1211 Encounter for screening for malignant neoplasm of colon: Secondary | ICD-10-CM

## 2024-06-07 MED ORDER — LEVOTHYROXINE 175 MCG TABLET: 175.0000 ug | ORAL_TABLET | Freq: Every day | ORAL | 1 refills | Status: AC

## 2024-06-07 NOTE — Progress Notes (Signed)
 S: 49yo F with hypothyroid  present today for follow up on hypothyroid    #Hypothyroid  -stable. No issues with prescription    #Depression  -symptom slightly worse. Mother recently diagnosed with Alzheimers. Grandmother also had condition. Patient concern about her developing dementia in the future.    #bruising  -still noted various bruising     #Colon cancer screening  -reports some family history. Would like to proceed with colonoscopy instead      Current Outpatient Medications on File Prior to Visit:     Albuterol  (PROAIR  HFA, PROVENTIL  HFA, VENTOLIN  HFA) 90 mcg/actuation inhaler    Azelastine  Nasal (ASTELIN ) 137 mcg (0.1 %) Spray    buPROPion  (Wellbutrin  XL) 300 mg XL tablet    doxycycline  monohydrate (Oracea ) 40 mg biphasic capsule    escitalopram  (Lexapro ) 20 mg tablet    Fluticasone  (FLONASE ) 50 mcg/actuation nasal spray    levoTHYROxine  150 mcg tablet    minocycline  (Dynacin ) 50 mg tablet    Montelukast  (SINGULAIR ) 10 mg Tablet    O:  Temp: 36.4 C (97.6 F) (01/23 0926)  Temp src: --  Pulse: 97 (01/23 0926)  BP: 121/70 (01/23 0926)  Resp: --  SpO2: 98 % (01/23 0926)  Height: --  Weight: 100.5 kg (221 lb 9 oz) (01/23 0926)  Gen: In no acute distress, pleasant  HEENT: NCAT,EOMI,  PHQ9: 3    A/P:  Zainab was seen today for bruising.  Diagnoses and all orders for this visit:  Preventive measure  Repeat prior to next appointment  -     CBC with Differential; Future  -     Comprehensive Metabolic Panel; Future  -     Hemoglobin A1C; Future  -     Lipid Panel with DLDL Reflex; Future  -     Thyroid  Stimulating Hormone; Future  Colon cancer screening  -     GASTROENTEROLOGY REFERRAL; Future  Depressive disorder  Chronic stable. Recent symptom relates to   Overview:  -has been on escitalopram  and bupropion  for many years. Symptom is stable   -seeing therapist   Hypothyroidism, unspecified type  Chronic at goal. Continue current regimen.  -     levoTHYROxine  175 mcg tablet; Take 1 tablet by mouth every morning  before a meal. Monday to Saturday  Dispense: 72 tablet; Refill: 1    2 of chronic condition at goal with prescription management   3+ tests ordered/ reviewed    Total time I spent in care of this patient today (excluding time spent on other billable services) was 15 minutes.     Next visit:  -Follow up routine wellness    Cheng-lun Blaise)  Cesario, MD  [facility]   PI# [phone]

## 2024-06-07 NOTE — Nursing Note (Signed)
 Danielle Barber has been verified by full name and DOB, vital obtained/documented, allergies verified, pharmacy confirmed.      [name], MA

## 2024-11-08 ENCOUNTER — Ambulatory Visit: Admitting: FAMILY PRACTICE
